# Patient Record
Sex: Male | Born: 1980 | Race: White | Hispanic: No | Marital: Single | Smoking: Current every day smoker
Health system: Southern US, Community
[De-identification: ages and names within clinical notes are randomized; demographics above are authoritative.]

## PROBLEM LIST (undated history)

## (undated) ENCOUNTER — Ambulatory Visit: Admission: EM | Payer: Commercial Managed Care - PPO | Source: Home / Self Care

## (undated) DIAGNOSIS — F909 Attention-deficit hyperactivity disorder, unspecified type: Secondary | ICD-10-CM

## (undated) HISTORY — PX: ELBOW SURGERY: SHX618

---

## 2010-08-03 ENCOUNTER — Emergency Department (HOSPITAL_COMMUNITY)
Admission: EM | Admit: 2010-08-03 | Discharge: 2010-08-03 | Disposition: A | Discharge status: Home / Self Care | Payer: Self-pay | Attending: Emergency Medicine | Admitting: Emergency Medicine

## 2010-08-03 ENCOUNTER — Emergency Department (HOSPITAL_COMMUNITY): Payer: Self-pay

## 2010-08-03 DIAGNOSIS — R221 Localized swelling, mass and lump, neck: Secondary | ICD-10-CM | POA: Insufficient documentation

## 2010-08-03 DIAGNOSIS — L0201 Cutaneous abscess of face: Secondary | ICD-10-CM | POA: Insufficient documentation

## 2010-08-03 DIAGNOSIS — R220 Localized swelling, mass and lump, head: Secondary | ICD-10-CM | POA: Insufficient documentation

## 2010-08-03 DIAGNOSIS — L03211 Cellulitis of face: Secondary | ICD-10-CM | POA: Insufficient documentation

## 2010-08-03 DIAGNOSIS — F988 Other specified behavioral and emotional disorders with onset usually occurring in childhood and adolescence: Secondary | ICD-10-CM | POA: Insufficient documentation

## 2010-08-03 DIAGNOSIS — B86 Scabies: Secondary | ICD-10-CM | POA: Insufficient documentation

## 2010-08-03 MED ORDER — IOHEXOL 300 MG/ML  SOLN
100.0000 mL | Freq: Once | INTRAMUSCULAR | Status: AC | PRN
  Administered 2010-08-03: 100 mL via INTRAVENOUS

## 2010-09-25 ENCOUNTER — Emergency Department (HOSPITAL_COMMUNITY)
Admission: EM | Admit: 2010-09-25 | Discharge: 2010-09-25 | Disposition: A | Discharge status: Home / Self Care | Payer: Self-pay | Attending: Emergency Medicine | Admitting: Emergency Medicine

## 2010-09-25 DIAGNOSIS — IMO0002 Reserved for concepts with insufficient information to code with codable children: Secondary | ICD-10-CM | POA: Insufficient documentation

## 2010-09-27 ENCOUNTER — Emergency Department (HOSPITAL_COMMUNITY)
Admission: EM | Admit: 2010-09-27 | Discharge: 2010-09-27 | Disposition: A | Discharge status: Home / Self Care | Payer: Self-pay | Attending: Emergency Medicine | Admitting: Emergency Medicine

## 2010-09-27 DIAGNOSIS — IMO0002 Reserved for concepts with insufficient information to code with codable children: Secondary | ICD-10-CM | POA: Insufficient documentation

## 2010-09-28 LAB — WOUND CULTURE

## 2010-12-06 ENCOUNTER — Emergency Department (HOSPITAL_COMMUNITY)
Admission: EM | Admit: 2010-12-06 | Discharge: 2010-12-06 | Disposition: A | Discharge status: Home / Self Care | Payer: Self-pay | Attending: Emergency Medicine | Admitting: Emergency Medicine

## 2010-12-06 DIAGNOSIS — IMO0002 Reserved for concepts with insufficient information to code with codable children: Secondary | ICD-10-CM | POA: Insufficient documentation

## 2012-04-05 ENCOUNTER — Encounter (HOSPITAL_COMMUNITY): Payer: Self-pay | Admitting: Emergency Medicine

## 2012-04-05 ENCOUNTER — Emergency Department (HOSPITAL_COMMUNITY)
Admission: EM | Admit: 2012-04-05 | Discharge: 2012-04-05 | Disposition: A | Discharge status: Home / Self Care | Payer: Self-pay | Attending: Emergency Medicine | Admitting: Emergency Medicine

## 2012-04-05 DIAGNOSIS — T542X1A Toxic effect of corrosive acids and acid-like substances, accidental (unintentional), initial encounter: Secondary | ICD-10-CM | POA: Insufficient documentation

## 2012-04-05 DIAGNOSIS — IMO0002 Reserved for concepts with insufficient information to code with codable children: Secondary | ICD-10-CM | POA: Insufficient documentation

## 2012-04-05 DIAGNOSIS — T5491XA Toxic effect of unspecified corrosive substance, accidental (unintentional), initial encounter: Secondary | ICD-10-CM | POA: Insufficient documentation

## 2012-04-05 DIAGNOSIS — X58XXXA Exposure to other specified factors, initial encounter: Secondary | ICD-10-CM | POA: Insufficient documentation

## 2012-04-05 DIAGNOSIS — Y9302 Activity, running: Secondary | ICD-10-CM | POA: Insufficient documentation

## 2012-04-05 DIAGNOSIS — T07XXXA Unspecified multiple injuries, initial encounter: Secondary | ICD-10-CM

## 2012-04-05 DIAGNOSIS — Y92009 Unspecified place in unspecified non-institutional (private) residence as the place of occurrence of the external cause: Secondary | ICD-10-CM | POA: Insufficient documentation

## 2012-04-05 DIAGNOSIS — R4182 Altered mental status, unspecified: Secondary | ICD-10-CM | POA: Insufficient documentation

## 2012-04-05 DIAGNOSIS — F19929 Other psychoactive substance use, unspecified with intoxication, unspecified: Secondary | ICD-10-CM

## 2012-04-05 DIAGNOSIS — F29 Unspecified psychosis not due to a substance or known physiological condition: Secondary | ICD-10-CM | POA: Insufficient documentation

## 2012-04-05 LAB — RAPID URINE DRUG SCREEN, HOSP PERFORMED
Amphetamines: POSITIVE — AB
Benzodiazepines: NOT DETECTED
Opiates: NOT DETECTED
Tetrahydrocannabinol: POSITIVE — AB

## 2012-04-05 LAB — URINALYSIS, ROUTINE W REFLEX MICROSCOPIC
Leukocytes, UA: NEGATIVE
Nitrite: NEGATIVE
Protein, ur: 100 mg/dL — AB
Specific Gravity, Urine: 1.026 (ref 1.005–1.030)
Urobilinogen, UA: 0.2 mg/dL (ref 0.0–1.0)

## 2012-04-05 LAB — CBC WITH DIFFERENTIAL/PLATELET
Basophils Absolute: 0 10*3/uL (ref 0.0–0.1)
Basophils Relative: 0 % (ref 0–1)
Eosinophils Absolute: 0 10*3/uL (ref 0.0–0.7)
Hemoglobin: 15.8 g/dL (ref 13.0–17.0)
MCH: 29.5 pg (ref 26.0–34.0)
MCHC: 34.1 g/dL (ref 30.0–36.0)
Neutro Abs: 9 10*3/uL — ABNORMAL HIGH (ref 1.7–7.7)
Neutrophils Relative %: 72 % (ref 43–77)
Platelets: 273 10*3/uL (ref 150–400)
RDW: 13 % (ref 11.5–15.5)

## 2012-04-05 LAB — BASIC METABOLIC PANEL
BUN: 16 mg/dL (ref 6–23)
Calcium: 10 mg/dL (ref 8.4–10.5)
GFR calc Af Amer: 64 mL/min — ABNORMAL LOW (ref >90)
GFR calc non Af Amer: 56 mL/min — ABNORMAL LOW (ref >90)
Glucose, Bld: 268 mg/dL — ABNORMAL HIGH (ref 70–99)
Potassium: 3.4 mEq/L — ABNORMAL LOW (ref 3.5–5.1)

## 2012-04-05 LAB — URINE MICROSCOPIC-ADD ON

## 2012-04-05 LAB — ETHANOL: Alcohol, Ethyl (B): <11 mg/dL (ref 0–11)

## 2012-04-05 MED ORDER — ZIPRASIDONE MESYLATE 20 MG IM SOLR
20.0000 mg | Freq: Once | INTRAMUSCULAR | Status: AC
  Administered 2012-04-05: 20 mg via INTRAMUSCULAR

## 2012-04-05 MED ORDER — ZIPRASIDONE MESYLATE 20 MG IM SOLR
INTRAMUSCULAR | Status: AC
  Administered 2012-04-05: 20 mg via INTRAMUSCULAR
  Filled 2012-04-05: qty 20

## 2012-04-05 MED ORDER — LACTATED RINGERS IV BOLUS (SEPSIS)
2000.0000 mL | Freq: Once | INTRAVENOUS | Status: AC
  Administered 2012-04-05: 2000 mL via INTRAVENOUS

## 2012-04-05 MED ORDER — SODIUM CHLORIDE 0.9 % IV SOLN: Freq: Once | INTRAVENOUS | Status: DC

## 2012-04-05 MED ORDER — SODIUM CHLORIDE 0.9 % IV SOLN
INTRAVENOUS | Status: DC
  Administered 2012-04-05 (×2): via INTRAVENOUS

## 2012-04-05 NOTE — ED Notes (Signed)
Pt has 1 cm open area to 4th finger right hand, area cleansed, DSD applied

## 2012-04-05 NOTE — ED Notes (Signed)
[employer] removed handcuff from pt after pt agreed to cooperate. Pt is cooperative and calm at this time. [employer] leaving a this time as there is no need for them to be at pt bedside. Sitter remains at pr bedside. NAD, VSS, bilateral, equal chest rise and fall. Will monitor

## 2012-04-05 NOTE — ED Notes (Signed)
Pt arrives via EMS, pt was found by [employer] running down the street w/o clothes, pt was restrained per [employer], EMS called, brought to [facility], resp even unlabored, skin pwd, pt has lacerations to hands, bleeding controlled

## 2012-04-05 NOTE — ED Provider Notes (Signed)
History     CSN: [id number]  Arrival date & time 04/05/12  0541   First MD Initiated Contact with Patient 04/05/12 0550      Chief Complaint  Patient presents with  . Hallucinations    (Consider location/radiation/quality/duration/timing/severity/associated sxs/prior treatment) HPI Level V caveat: Altered mental status, psychosis, uncooperative. This is a 31 year old white male who came home to his roommates this morning reporting that he had taken "acid". The patient subsequently had increasingly more bizarre behavior with agitation and apparent loosening of patient's. He began removing is close and ran outside. Police were called. He was uncooperative with police, rolling on the ground naked and resisting their authority. He was Tazered and brought to the ED in handcuffs. Police report he also states he may have taken some methamphetamine. He has multiple superficial abrasions and contusions over his body with some rash of the right hand it has been bleeding moderately.  No past medical history on file.  No past surgical history on file.  No family history on file.  History  Substance Use Topics  . Smoking status: Not on file  . Smokeless tobacco: Not on file  . Alcohol Use: Not on file      Review of Systems  Unable to perform ROS   Allergies  Review of patient's allergies indicates no known allergies.  Home Medications  No current outpatient prescriptions on file.  BP 130/77  Pulse 114  Temp 97.8 F (36.6 C)  Resp 13  SpO2 95%  Physical Exam General: Well-developed, well-nourished male; appearance consistent with age of record HENT: normocephalic, atraumatic; dry blood in mouth, no dental fractures seen once blood was removed Eyes: pupils equal round and reactive to light; extraocular muscles intact Neck: supple Heart: regular rate and rhythm Lungs: Normal respiratory effort and excursion Abdomen: soft; nondistended Extremities: No deformity; full range  of motion Neurologic: Awake, alert, agitated; motor function intact in all extremities and symmetric; no facial droop Skin: Multiple superficial abrasions and contusions; "road rash" of right hand Psychiatric: Confused, disoriented, alternately belligerent and cooperative, inappropriate speech and answers to questions    ED Course  Procedures (including critical care time)  CRITICAL CARE Performed by: Orissa Arreaga L   Total critical care time: 45 minutes  Critical care time was exclusive of separately billable procedures and treating other patients.  Critical care was necessary to treat or prevent imminent or life-threatening deterioration.  Critical care was time spent personally by me on the following activities: development of treatment plan with patient and/or surrogate as well as nursing, discussions with consultants, evaluation of patient's response to treatment, examination of patient, obtaining history from patient or surrogate, ordering and performing treatments and interventions, ordering and review of laboratory studies, ordering and review of radiographic studies, pulse oximetry and re-evaluation of patient's condition.    MDM   Nursing notes and vitals signs, including pulse oximetry, reviewed.  Summary of this visit's results, reviewed by myself:  Labs:  Results for orders placed during the hospital encounter of 04/05/12 (from the past 24 hour(s))  ETHANOL     Status: Normal   Collection Time   04/05/12  6:10 AM      Component Value Range   Alcohol, Ethyl (B) <11  0 - 11 mg/dL  BASIC METABOLIC PANEL     Status: Abnormal   Collection Time   04/05/12  6:10 AM      Component Value Range   Sodium 136  135 - 145 mEq/L   Potassium  3.4 (*) 3.5 - 5.1 mEq/L   Chloride 93 (*) 96 - 112 mEq/L   CO2 17 (*) 19 - 32 mEq/L   Glucose, Bld 268 (*) 70 - 99 mg/dL   BUN 16  6 - 23 mg/dL   Creatinine, Ser 1.61 (*) 0.50 - 1.35 mg/dL   Calcium 09.6  8.4 - 04.5 mg/dL   GFR calc  non Af Amer 56 (*) >90 mL/min   GFR calc Af Amer 64 (*) >90 mL/min  CBC WITH DIFFERENTIAL     Status: Abnormal   Collection Time   04/05/12  6:10 AM      Component Value Range   WBC 12.5 (*) 4.0 - 10.5 K/uL   RBC 5.35  4.22 - 5.81 MIL/uL   Hemoglobin 15.8  13.0 - 17.0 g/dL   HCT 40.9  81.1 - 91.4 %   MCV 86.5  78.0 - 100.0 fL   MCH 29.5  26.0 - 34.0 pg   MCHC 34.1  30.0 - 36.0 g/dL   RDW 78.2  95.6 - 21.3 %   Platelets 273  150 - 400 K/uL   Neutrophils Relative 72  43 - 77 %   Neutro Abs 9.0 (*) 1.7 - 7.7 K/uL   Lymphocytes Relative 22  12 - 46 %   Lymphs Abs 2.7  0.7 - 4.0 K/uL   Monocytes Relative 5  3 - 12 %   Monocytes Absolute 0.7  0.1 - 1.0 K/uL   Eosinophils Relative 0  0 - 5 %   Eosinophils Absolute 0.0  0.0 - 0.7 K/uL   Basophils Relative 0  0 - 1 %   Basophils Absolute 0.0  0.0 - 0.1 K/uL  URINE RAPID DRUG SCREEN (HOSP PERFORMED)     Status: Abnormal   Collection Time   04/05/12  6:21 AM      Component Value Range   Opiates NONE DETECTED  NONE DETECTED   Cocaine NONE DETECTED  NONE DETECTED   Benzodiazepines NONE DETECTED  NONE DETECTED   Amphetamines POSITIVE (*) NONE DETECTED   Tetrahydrocannabinol POSITIVE (*) NONE DETECTED   Barbiturates NONE DETECTED  NONE DETECTED  URINALYSIS, ROUTINE W REFLEX MICROSCOPIC     Status: Abnormal   Collection Time   04/05/12  6:21 AM      Component Value Range   Color, Urine YELLOW  YELLOW   APPearance CLOUDY (*) CLEAR   Specific Gravity, Urine 1.026  1.005 - 1.030   pH 5.5  5.0 - 8.0   Glucose, UA NEGATIVE  NEGATIVE mg/dL   Hgb urine dipstick SMALL (*) NEGATIVE   Bilirubin Urine NEGATIVE  NEGATIVE   Ketones, ur NEGATIVE  NEGATIVE mg/dL   Protein, ur 086 (*) NEGATIVE mg/dL   Urobilinogen, UA 0.2  0.0 - 1.0 mg/dL   Nitrite NEGATIVE  NEGATIVE   Leukocytes, UA NEGATIVE  NEGATIVE  URINE MICROSCOPIC-ADD ON     Status: Abnormal   Collection Time   04/05/12  6:21 AM      Component Value Range   Squamous Epithelial / LPF  RARE  RARE   WBC, UA 0-2  <3 WBC/hpf   RBC / HPF 0-2  <3 RBC/hpf   Bacteria, UA MANY (*) RARE   Casts HYALINE CASTS (*) NEGATIVE   Sperm, UA PRESENT       EKG Interpretation:  Date & Time: 04/05/2012 6:08 AM  Rate: 132  Rhythm: sinus tachycardia  QRS Axis: normal  Intervals: QT prolonged  ST/T Wave  abnormalities: normal  Conduction Disutrbances:none  Narrative Interpretation:   Old EKG Reviewed: none available  6:56 AM Patient sleeping comfortably now after Geodon 20 mg IM. Patient placed on warming blanket and core temperature is now 69 F. Patient noted to be positive for amphetamines and THC.         Carlisle Beers Julieta Rogalski, MD 04/05/12 0700

## 2012-04-05 NOTE — ED Notes (Signed)
Sitter at bedside. Pt resting quietly. VSS, NAD. Will monitor

## 2012-04-05 NOTE — ED Provider Notes (Signed)
Assume care from Dr. Read Drivers, 0730- patient remains sedated. Heart rate 105. Blood sugar 96 systolic. IV fluid boluses, 2 L have been ordered. Temperature 98.6 by Foley catheter sensor.  Reevaluation: 08:52- blood pressure 116/63. He remains sedated. He continues to have very poor urinary output. Will give third liter of IV fluids.  Reevaluation: 09:23- he is more alert now, awakes, and talks appropriately; then, dozes off. Blood pressure, normal, stable.  Reevaluation: 13:00- he is alert, calm, lucid. He remembers many of the events from last night. He, states that his only ingestion was "acid". He complains of minor abrasions and contusions, but otherwise denies pain or limitations. He has tolerated food and fluid here in the ER.  Medical decision making: Acute and transient altered mental status, secondary to ingestion of unknown medication. Patient has recovered his baseline, stable for discharge.  Diagnosis #1 altered mental status . #2 substance abuse  Plan: Return if needed. His IVC was rescinded.  Results for orders placed during the hospital encounter of 04/05/12  ETHANOL      Component Value Range   Alcohol, Ethyl (B) <11  0 - 11 mg/dL  URINE RAPID DRUG SCREEN (HOSP PERFORMED)      Component Value Range   Opiates NONE DETECTED  NONE DETECTED   Cocaine NONE DETECTED  NONE DETECTED   Benzodiazepines NONE DETECTED  NONE DETECTED   Amphetamines POSITIVE (*) NONE DETECTED   Tetrahydrocannabinol POSITIVE (*) NONE DETECTED   Barbiturates NONE DETECTED  NONE DETECTED  BASIC METABOLIC PANEL      Component Value Range   Sodium 136  135 - 145 mEq/L   Potassium 3.4 (*) 3.5 - 5.1 mEq/L   Chloride 93 (*) 96 - 112 mEq/L   CO2 17 (*) 19 - 32 mEq/L   Glucose, Bld 268 (*) 70 - 99 mg/dL   BUN 16  6 - 23 mg/dL   Creatinine, Ser 1.47 (*) 0.50 - 1.35 mg/dL   Calcium 82.9  8.4 - 56.2 mg/dL   GFR calc non Af Amer 56 (*) >90 mL/min   GFR calc Af Amer 64 (*) >90 mL/min  CBC WITH DIFFERENTIAL     Component Value Range   WBC 12.5 (*) 4.0 - 10.5 K/uL   RBC 5.35  4.22 - 5.81 MIL/uL   Hemoglobin 15.8  13.0 - 17.0 g/dL   HCT 13.0  86.5 - 78.4 %   MCV 86.5  78.0 - 100.0 fL   MCH 29.5  26.0 - 34.0 pg   MCHC 34.1  30.0 - 36.0 g/dL   RDW 69.6  29.5 - 28.4 %   Platelets 273  150 - 400 K/uL   Neutrophils Relative 72  43 - 77 %   Neutro Abs 9.0 (*) 1.7 - 7.7 K/uL   Lymphocytes Relative 22  12 - 46 %   Lymphs Abs 2.7  0.7 - 4.0 K/uL   Monocytes Relative 5  3 - 12 %   Monocytes Absolute 0.7  0.1 - 1.0 K/uL   Eosinophils Relative 0  0 - 5 %   Eosinophils Absolute 0.0  0.0 - 0.7 K/uL   Basophils Relative 0  0 - 1 %   Basophils Absolute 0.0  0.0 - 0.1 K/uL  URINALYSIS, ROUTINE W REFLEX MICROSCOPIC      Component Value Range   Color, Urine YELLOW  YELLOW   APPearance CLOUDY (*) CLEAR   Specific Gravity, Urine 1.026  1.005 - 1.030   pH 5.5  5.0 -  8.0   Glucose, UA NEGATIVE  NEGATIVE mg/dL   Hgb urine dipstick SMALL (*) NEGATIVE   Bilirubin Urine NEGATIVE  NEGATIVE   Ketones, ur NEGATIVE  NEGATIVE mg/dL   Protein, ur 409 (*) NEGATIVE mg/dL   Urobilinogen, UA 0.2  0.0 - 1.0 mg/dL   Nitrite NEGATIVE  NEGATIVE   Leukocytes, UA NEGATIVE  NEGATIVE  URINE MICROSCOPIC-ADD ON      Component Value Range   Squamous Epithelial / LPF RARE  RARE   WBC, UA 0-2  <3 WBC/hpf   RBC / HPF 0-2  <3 RBC/hpf   Bacteria, UA MANY (*) RARE   Casts HYALINE CASTS (*) NEGATIVE   Sperm, UA PRESENT      Flint Melter, MD 04/06/12 1240

## 2012-04-05 NOTE — ED Notes (Signed)
Pt brought to ED after roommate called PD re pt acting erratic, breaking windows, yelling. Pt found by [employer] in street naked, aggressive. Pt tazed and handcuffed by [employer]. Pt intermittently yelling, repeating questions. Inappropriate answered to questions. Pt alert with abrasions to face,arms,hands,legs,feet. Pt cleaned by tech, including mouth, teeth which were coated in dried blood. Pt very cold to touch, bair hugger applied. Pt given Geodon IM. Pt placed on CCM, temp probe foley placed. IV est, labs drawn, IVF started. Forensic restraints remain in place. [employer] at bedside.

## 2012-04-06 LAB — URINE CULTURE: Colony Count: NO GROWTH

## 2012-04-08 ENCOUNTER — Encounter (HOSPITAL_COMMUNITY): Payer: Self-pay | Admitting: Emergency Medicine

## 2012-04-08 ENCOUNTER — Emergency Department (HOSPITAL_COMMUNITY)
Admission: EM | Admit: 2012-04-08 | Discharge: 2012-04-08 | Disposition: A | Discharge status: Home / Self Care | Payer: Self-pay | Attending: Emergency Medicine | Admitting: Emergency Medicine

## 2012-04-08 ENCOUNTER — Emergency Department (HOSPITAL_COMMUNITY): Payer: Self-pay

## 2012-04-08 DIAGNOSIS — M25572 Pain in left ankle and joints of left foot: Secondary | ICD-10-CM

## 2012-04-08 DIAGNOSIS — Z79899 Other long term (current) drug therapy: Secondary | ICD-10-CM | POA: Insufficient documentation

## 2012-04-08 DIAGNOSIS — F17200 Nicotine dependence, unspecified, uncomplicated: Secondary | ICD-10-CM | POA: Insufficient documentation

## 2012-04-08 DIAGNOSIS — M25579 Pain in unspecified ankle and joints of unspecified foot: Secondary | ICD-10-CM | POA: Insufficient documentation

## 2012-04-08 MED ORDER — MELOXICAM 15 MG PO TABS: 15.0000 mg | ORAL_TABLET | Freq: Every day | ORAL | Status: DC

## 2012-04-08 MED ORDER — IBUPROFEN 800 MG PO TABS
800.0000 mg | ORAL_TABLET | Freq: Once | ORAL | Status: DC
  Filled 2012-04-08: qty 1

## 2012-04-08 NOTE — ED Provider Notes (Signed)
History     CSN: [id number]  Arrival date & time 04/08/12  0607   First MD Initiated Contact with Patient 04/08/12 [phone]      Chief Complaint  Patient presents with  . Ankle Pain    (Consider location/radiation/quality/duration/timing/severity/associated sxs/prior treatment) HPI Comments: Jeffery Aguilar 31 y.o. male   The chief complaint is: Patient presents with:   Ankle Pain     31 year old male presents to emergency department with chief complaint of left foot and ankle pain.  He states he has had intermittent foot pain bilaterally over the past few years.  He has had a 40 pound weight gain over the past year which he feels contributes to his foot pain.  He states that last night he was D. gating and with stopping his left foot music.  He experienced sharp pain in his foot and ankle and was unable to bear weight.  He has noticed swelling over the dorsum of the foot and bilateral in the malleoli.   He denies any recent injury to the foot, although patient was seen here in the emergency department per min recently in drug-induced psychosis has multiple abrasions over the body and may have injured his ankle unknowingly at that time. Denies fevers, chills, myalgias, arthralgias. Denies DOE, SOB, chest tightness or pressure, radiation to left arm, jaw or back, or diaphoresis. Denies dysuria, flank pain, suprapubic pain, frequency, urgency, or hematuria. Denies headaches, light headedness, weakness, visual disturbances. Denies abdominal pain, nausea, vomiting, diarrhea or constipation.    Patient is a 31 y.o. male presenting with ankle pain. The history is provided by the patient. No language interpreter was used.  Ankle Pain     History reviewed. No pertinent past medical history.  Past Surgical History  Procedure Date  . Elbow surgery     History reviewed. No pertinent family history.  History  Substance Use Topics  . Smoking status: Current Every Day Smoker    Types:  Cigarettes  . Smokeless tobacco: Not on file  . Alcohol Use: Yes     Comment: rare      Review of Systems Ten systems reviewed and are negative for acute change, except as noted in the HPI.   Allergies  Review of patient's allergies indicates no known allergies.  Home Medications   Current Outpatient Rx  Name  Route  Sig  Dispense  Refill  . ALPRAZOLAM 1 MG PO TABS   Oral   Take 1 mg by mouth 2 (two) times daily as needed. For anxiety         . AMPHETAMINE-DEXTROAMPHETAMINE 20 MG PO TABS   Oral   Take 20 mg by mouth 3 (three) times daily.          . IBUPROFEN 200 MG PO TABS   Oral   Take 800 mg by mouth every 6 (six) hours as needed. For pain           BP 126/92  Pulse 108  Temp 98.9 F (37.2 C) (Oral)  Resp 20  SpO2 100%  Physical Exam Physical Exam  Nursing note and vitals reviewed. Constitutional: He appears well-developed and well-nourished. No distress.  HENT:  Head: Normocephalic and atraumatic.  Eyes: Conjunctivae normal are normal. No scleral icterus.  Neck: Normal range of motion. Neck supple.  Cardiovascular: Normal rate, regular rhythm and normal heart sounds.   Pulmonary/Chest: Effort normal and breath sounds normal. No respiratory distress.  Abdominal: Soft. There is no tenderness.  Musculoskeletal:  A bilateral  ankle exam was performed. Left ankle exhibits Skin: normal Swelling:  mild Warmth: no warmth Tenderness: moderate and dorsal taso-metatarsal junction and  BL malloeoli  ROM: limited by pain Strength:  limited by pain Neurological Exam: normal Vascular Exam: normal Neurological: A&O  x4 Skin: Skin is warm and dry. He is not diaphoretic.  Psychiatric: His behavior is normal.    ED Course  Procedures (including critical care time)  Labs Reviewed - No data to display Dg Ankle Complete Left  04/08/2012  *RADIOLOGY REPORT*  Clinical Data: Pain.  LEFT ANKLE COMPLETE - 3+ VIEW  Comparison: None.  Findings: There is no evidence  for fracture, subluxation or dislocation.  No worrisome lytic or sclerotic osseous lesion. There is some trace spurring in the anterior tibiotalar joint.  IMPRESSION: No acute bony findings.  Mild degenerative change.   Original Report Authenticated By: Kennith Center, M.D.      No diagnosis found.    MDM  7:17 AM BP 126/92  Pulse 108  Temp 98.9 F (37.2 C) (Oral)  Resp 20  SpO2 100% Patient with ankle pain and swelling. No specific MOI. Obtaining xray.   Negative xray. Will d/c ASO ankle splint, crutches and podiatric f/u.  Meloxicam for inflammation, Discussed reasons to seek immediate care. Patient expresses understanding and agrees with plan.    Arthor Captain, PA-C 04/08/12 1547

## 2012-04-08 NOTE — ED Notes (Signed)
Pt states he is having pain in his left ankle  Pt has swelling noted  Pt states he has been having problems with it for some time but it is hurting worse

## 2012-04-09 NOTE — ED Provider Notes (Signed)
Medical screening examination/treatment/procedure(s) were performed by non-physician practitioner and as supervising physician I was immediately available for consultation/collaboration.    Vida Roller, MD 04/09/12 [phone]

## 2012-06-01 ENCOUNTER — Encounter (HOSPITAL_COMMUNITY): Payer: Self-pay | Admitting: Emergency Medicine

## 2012-06-01 ENCOUNTER — Emergency Department (HOSPITAL_COMMUNITY): Payer: Self-pay

## 2012-06-01 ENCOUNTER — Emergency Department (HOSPITAL_COMMUNITY)
Admission: EM | Admit: 2012-06-01 | Discharge: 2012-06-01 | Disposition: A | Discharge status: Home / Self Care | Payer: Self-pay | Attending: Emergency Medicine | Admitting: Emergency Medicine

## 2012-06-01 DIAGNOSIS — F17200 Nicotine dependence, unspecified, uncomplicated: Secondary | ICD-10-CM | POA: Insufficient documentation

## 2012-06-01 DIAGNOSIS — Y9389 Activity, other specified: Secondary | ICD-10-CM | POA: Insufficient documentation

## 2012-06-01 DIAGNOSIS — S93402A Sprain of unspecified ligament of left ankle, initial encounter: Secondary | ICD-10-CM

## 2012-06-01 DIAGNOSIS — Z79899 Other long term (current) drug therapy: Secondary | ICD-10-CM | POA: Insufficient documentation

## 2012-06-01 DIAGNOSIS — M779 Enthesopathy, unspecified: Secondary | ICD-10-CM

## 2012-06-01 DIAGNOSIS — IMO0002 Reserved for concepts with insufficient information to code with codable children: Secondary | ICD-10-CM | POA: Insufficient documentation

## 2012-06-01 DIAGNOSIS — S93409A Sprain of unspecified ligament of unspecified ankle, initial encounter: Secondary | ICD-10-CM | POA: Insufficient documentation

## 2012-06-01 DIAGNOSIS — Y929 Unspecified place or not applicable: Secondary | ICD-10-CM | POA: Insufficient documentation

## 2012-06-01 MED ORDER — IBUPROFEN 600 MG PO TABS: 600.0000 mg | ORAL_TABLET | Freq: Four times a day (QID) | ORAL | Status: DC | PRN

## 2012-06-01 MED ORDER — TRAMADOL HCL 50 MG PO TABS: 50.0000 mg | ORAL_TABLET | Freq: Four times a day (QID) | ORAL | Status: DC | PRN

## 2012-06-01 NOTE — ED Notes (Signed)
Pt reports being seen here for same last week.  Pt reports his L foot started to swell x 2 days.

## 2012-06-01 NOTE — ED Provider Notes (Signed)
History     CSN: [id number]  Arrival date & time 06/01/12  1201   First MD Initiated Contact with Patient 06/01/12 1231      Chief Complaint  Patient presents with  . Foot Pain    (Consider location/radiation/quality/duration/timing/severity/associated sxs/prior treatment) HPI Jeffery Aguilar is a 32 y.o. male who presents to ED with complaint of left foot and ankle swelling and pain. States initially injured it about 3 wks ago, after he was tased by police and he fell down twisting his ankle. He was seen in ER, had negative x-rays. States since then he has not been resting it. States has been working on it "a lot" and not sure if he twisted it again. States now ankle and foot is even more swollen. Denies calf pain. No hx of DVTs. No numbness or weakness in foot.   History reviewed. No pertinent past medical history.  Past Surgical History  Procedure Laterality Date  . Elbow surgery      History reviewed. No pertinent family history.  History  Substance Use Topics  . Smoking status: Current Every Day Smoker    Types: Cigarettes  . Smokeless tobacco: Not on file  . Alcohol Use: Yes     Comment: rare      Review of Systems  Musculoskeletal: Positive for joint swelling and arthralgias.  Skin: Negative for wound.  Neurological: Negative for weakness and numbness.    Allergies  Review of patient's allergies indicates no known allergies.  Home Medications   Current Outpatient Rx  Name  Route  Sig  Dispense  Refill  . ALPRAZolam (XANAX) 1 MG tablet   Oral   Take 1 mg by mouth 2 (two) times daily as needed. For anxiety         . amphetamine-dextroamphetamine (ADDERALL) 20 MG tablet   Oral   Take 20 mg by mouth 3 (three) times daily.          Marland Kitchen ibuprofen (ADVIL,MOTRIN) 200 MG tablet   Oral   Take 800 mg by mouth every 6 (six) hours as needed. For pain           BP 132/85  Pulse 110  Temp(Src) 98 F (36.7 C) (Oral)  SpO2 98%  Physical Exam  Nursing note  and vitals reviewed. Constitutional: He appears well-developed and well-nourished. No distress.  Musculoskeletal:  Swelling noted to the left ankle and foot. Tender to palpation over lateral and medial melleoli. Pain with ROM of the ankle. Normal dorsal pedal pulses. No calf swelling or tenderness.   Neurological: He is alert.  Skin: Skin is warm and dry.    ED Course  Procedures (including critical care time)  Labs Reviewed - No data to display Dg Ankle Complete Left  06/01/2012  *RADIOLOGY REPORT*  Clinical Data: Lateral pain and swelling  LEFT ANKLE COMPLETE - 3+ VIEW  Comparison: 04/08/2012  Findings: There is lateral soft tissue swelling, more pronounced than was seen previously.  No evidence of fracture.  There is mild anterior spurring at the tibiotalar articulation.  IMPRESSION: More prominent lateral soft tissue swelling.  Anterior degenerative spurring at the tibiotalar articulation appears the same.   Original Report Authenticated By: Paulina Fusi, M.D.    Dg Foot Complete Left  06/01/2012  *RADIOLOGY REPORT*  Clinical Data: Lateral pain and swelling  LEFT FOOT - COMPLETE 3+ VIEW  Comparison: None.  Findings: There is some spurring at the calcaneocuboid articulation.  I question if there could be coalition at the  calcaneonavicular level.  IMPRESSION: Mild spurring at the calcaneocuboid articulation.  Question the possibility of calcaneonavicular coalition.   Original Report Authenticated By: Paulina Fusi, M.D.      1. Left ankle sprain   2. Tendonitis       MDM  Pt with left foot swelling, injury 3 wks ago. Unsure of any new injuries. No signs of joint infection. Joints stable. 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 Will treat with NSAIDs, ultram. Crutches. Elevation of the foot. ACE wrap. Follow up with orthopedics. Referral given.        Lottie Mussel, PA 06/01/12 1413  Lottie Mussel, [place] 06/01/12 1429

## 2012-06-01 NOTE — ED Notes (Addendum)
Pt seen in December for left foot pain, referred to orthopedist but states he hasn't been since he doesn't have insurance.  Pt states 2 days ago, foot began to hurt again and became swollen.  Pt denies pain at this time but states swelling has not improved.  Minimal swelling noted to lateral aspect of left foot.

## 2012-06-04 NOTE — ED Provider Notes (Signed)
Medical screening examination/treatment/procedure(s) were performed by non-physician practitioner and as supervising physician I was immediately available for consultation/collaboration.  Tae Robak, MD 06/04/12 0746 

## 2012-11-18 ENCOUNTER — Emergency Department (HOSPITAL_COMMUNITY)
Admission: EM | Admit: 2012-11-18 | Discharge: 2012-11-19 | Disposition: A | Discharge status: Home / Self Care | Payer: Self-pay | Attending: Emergency Medicine | Admitting: Emergency Medicine

## 2012-11-18 ENCOUNTER — Encounter (HOSPITAL_COMMUNITY): Payer: Self-pay

## 2012-11-18 DIAGNOSIS — Z79899 Other long term (current) drug therapy: Secondary | ICD-10-CM | POA: Insufficient documentation

## 2012-11-18 DIAGNOSIS — Z9889 Other specified postprocedural states: Secondary | ICD-10-CM | POA: Insufficient documentation

## 2012-11-18 DIAGNOSIS — F17200 Nicotine dependence, unspecified, uncomplicated: Secondary | ICD-10-CM | POA: Insufficient documentation

## 2012-11-18 DIAGNOSIS — M7989 Other specified soft tissue disorders: Secondary | ICD-10-CM | POA: Insufficient documentation

## 2012-11-18 DIAGNOSIS — M25529 Pain in unspecified elbow: Secondary | ICD-10-CM | POA: Insufficient documentation

## 2012-11-18 DIAGNOSIS — M25521 Pain in right elbow: Secondary | ICD-10-CM

## 2012-11-18 DIAGNOSIS — R209 Unspecified disturbances of skin sensation: Secondary | ICD-10-CM | POA: Insufficient documentation

## 2012-11-18 NOTE — ED Notes (Signed)
Pts right elbow is swollen and sore, he had surgery on it when he was younger, about one month ago he hit it and it went numb for a brief time, yesterday it started to swell in that area

## 2012-11-19 ENCOUNTER — Emergency Department (HOSPITAL_COMMUNITY): Payer: Self-pay

## 2012-11-19 MED ORDER — IBUPROFEN 800 MG PO TABS
800.0000 mg | ORAL_TABLET | Freq: Once | ORAL | Status: AC
  Administered 2012-11-19: 800 mg via ORAL
  Filled 2012-11-19: qty 1

## 2012-11-19 MED ORDER — MELOXICAM 15 MG PO TABS: 15.0000 mg | ORAL_TABLET | Freq: Every day | ORAL | Status: DC

## 2012-11-19 NOTE — ED Provider Notes (Signed)
CSN: [id number]     Arrival date & time 11/18/12  2338 History     First MD Initiated Contact with Patient 11/19/12 0000     Chief Complaint  Patient presents with  . Elbow Pain   HPI  History provided by the patient. Patient is a 32 year old male with past history of right elbow surgery orthopedic pinning who presents with complaints of right elbow pain and swelling. Pain and swelling began yesterday and are worse with any movements or palpation. Patient denies any recent injury or trauma but does report hitting his elbow one month ago while at work against his truck. He does report having immediate numbness shooting down his entire arm with pain but his symptoms seem to have improved. He did have some mild discomforts to the elbow since that time. Patient has done some increased activity and work with his right hand. 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 He denies any redness or skin change. Denies any associated weakness to the hand or fingers. No fever, chills or sweats. He has not used any treatments for symptoms. No other aggravating or alleviating factors. No other associated symptoms.     History reviewed. No pertinent past medical history. Past Surgical History  Procedure Laterality Date  . Elbow surgery     History reviewed. No pertinent family history. History  Substance Use Topics  . Smoking status: Current Every Day Smoker    Types: Cigarettes  . Smokeless tobacco: Not on file  . Alcohol Use: Yes     Comment: rare    Review of Systems  Constitutional: Negative for fever and chills.  Neurological: Positive for numbness. Negative for weakness.  All other systems reviewed and are negative.    Allergies  Review of patient's allergies indicates no known allergies.  Home Medications   Current Outpatient Rx  Name  Route  Sig  Dispense  Refill  . ALPRAZolam (XANAX) 1 MG tablet   Oral   Take 1 mg by mouth 2 (two) times daily as needed.  For anxiety         . amphetamine-dextroamphetamine (ADDERALL) 20 MG tablet   Oral   Take 20 mg by mouth 3 (three) times daily.           BP 141/97  Pulse 115  Temp(Src) 99 F (37.2 C) (Oral)  Resp 18  SpO2 96% Physical Exam  Nursing note and vitals reviewed. Constitutional: He is oriented to person, place, and time. He appears well-developed and well-nourished. No distress.  HENT:  Head: Normocephalic.  Cardiovascular: Normal rate and regular rhythm.   No murmur heard. Pulmonary/Chest: Effort normal and breath sounds normal. No respiratory distress. He has no wheezes. He has no rales.  Abdominal: Soft.  Musculoskeletal: He exhibits tenderness.  Old surgical scar over the medial aspect of the right elbow consistent with history of surgery. There is mild swelling to this area with tenderness to palpation. Mild tenderness extending into the medial forearm. Mildly reduced range of motion at the elbow with pain. Normal grip strength the right hand. Normal distal pulses and cap refill. Normal sensation to the fingertips to light touch.  Neurological: He is alert and oriented to person, place, and time.  Skin: Skin is warm. No rash noted. No erythema.  Psychiatric: He has a normal mood and affect. His behavior is normal.    ED Course   Procedures   Dg Elbow Complete Right  11/19/2012   *RADIOLOGY REPORT*  Clinical Data: Right elbow pain with medial soft tissue swelling.  RIGHT ELBOW - COMPLETE 3+ VIEW  Comparison: None.  Findings: There is soft tissue swelling overlying the medial epicondyle.  No underlying foreign body or soft tissue gas is identified.  Bony structures are intact without evidence of fracture or dislocation.  No joint effusion is appreciated.  IMPRESSION: Medial soft tissue swelling.  No evidence of underlying bony abnormality or joint effusion.   Original Report Authenticated By: Irish Lack, M.D.     1. Elbow joint pain, right     MDM  12:30AM patient  seen and evaluated. Patient well-appearing does not appear in any acute distress.  X-rays reviewed and unremarkable. There is slight swelling to the medial aspect consistent with exam. This may be bursitis or local inflammatory reaction from use. Will recommend RICE treatment.   Angus Seller, PA-C 11/19/12 [phone]

## 2012-11-19 NOTE — ED Provider Notes (Signed)
Medical screening examination/treatment/procedure(s) were performed by non-physician practitioner and as supervising physician I was immediately available for consultation/collaboration.  Marshel Golubski, MD 11/19/12 0402 

## 2013-03-15 DIAGNOSIS — F909 Attention-deficit hyperactivity disorder, unspecified type: Secondary | ICD-10-CM | POA: Insufficient documentation

## 2013-05-16 IMAGING — CR DG FOOT COMPLETE 3+V*L*
3 series · 3 of 3 positions shown · non-contrast
Comparison: None.

CLINICAL DATA: Lateral pain and swelling

LEFT FOOT - COMPLETE 3+ VIEW

[x foot lat left]
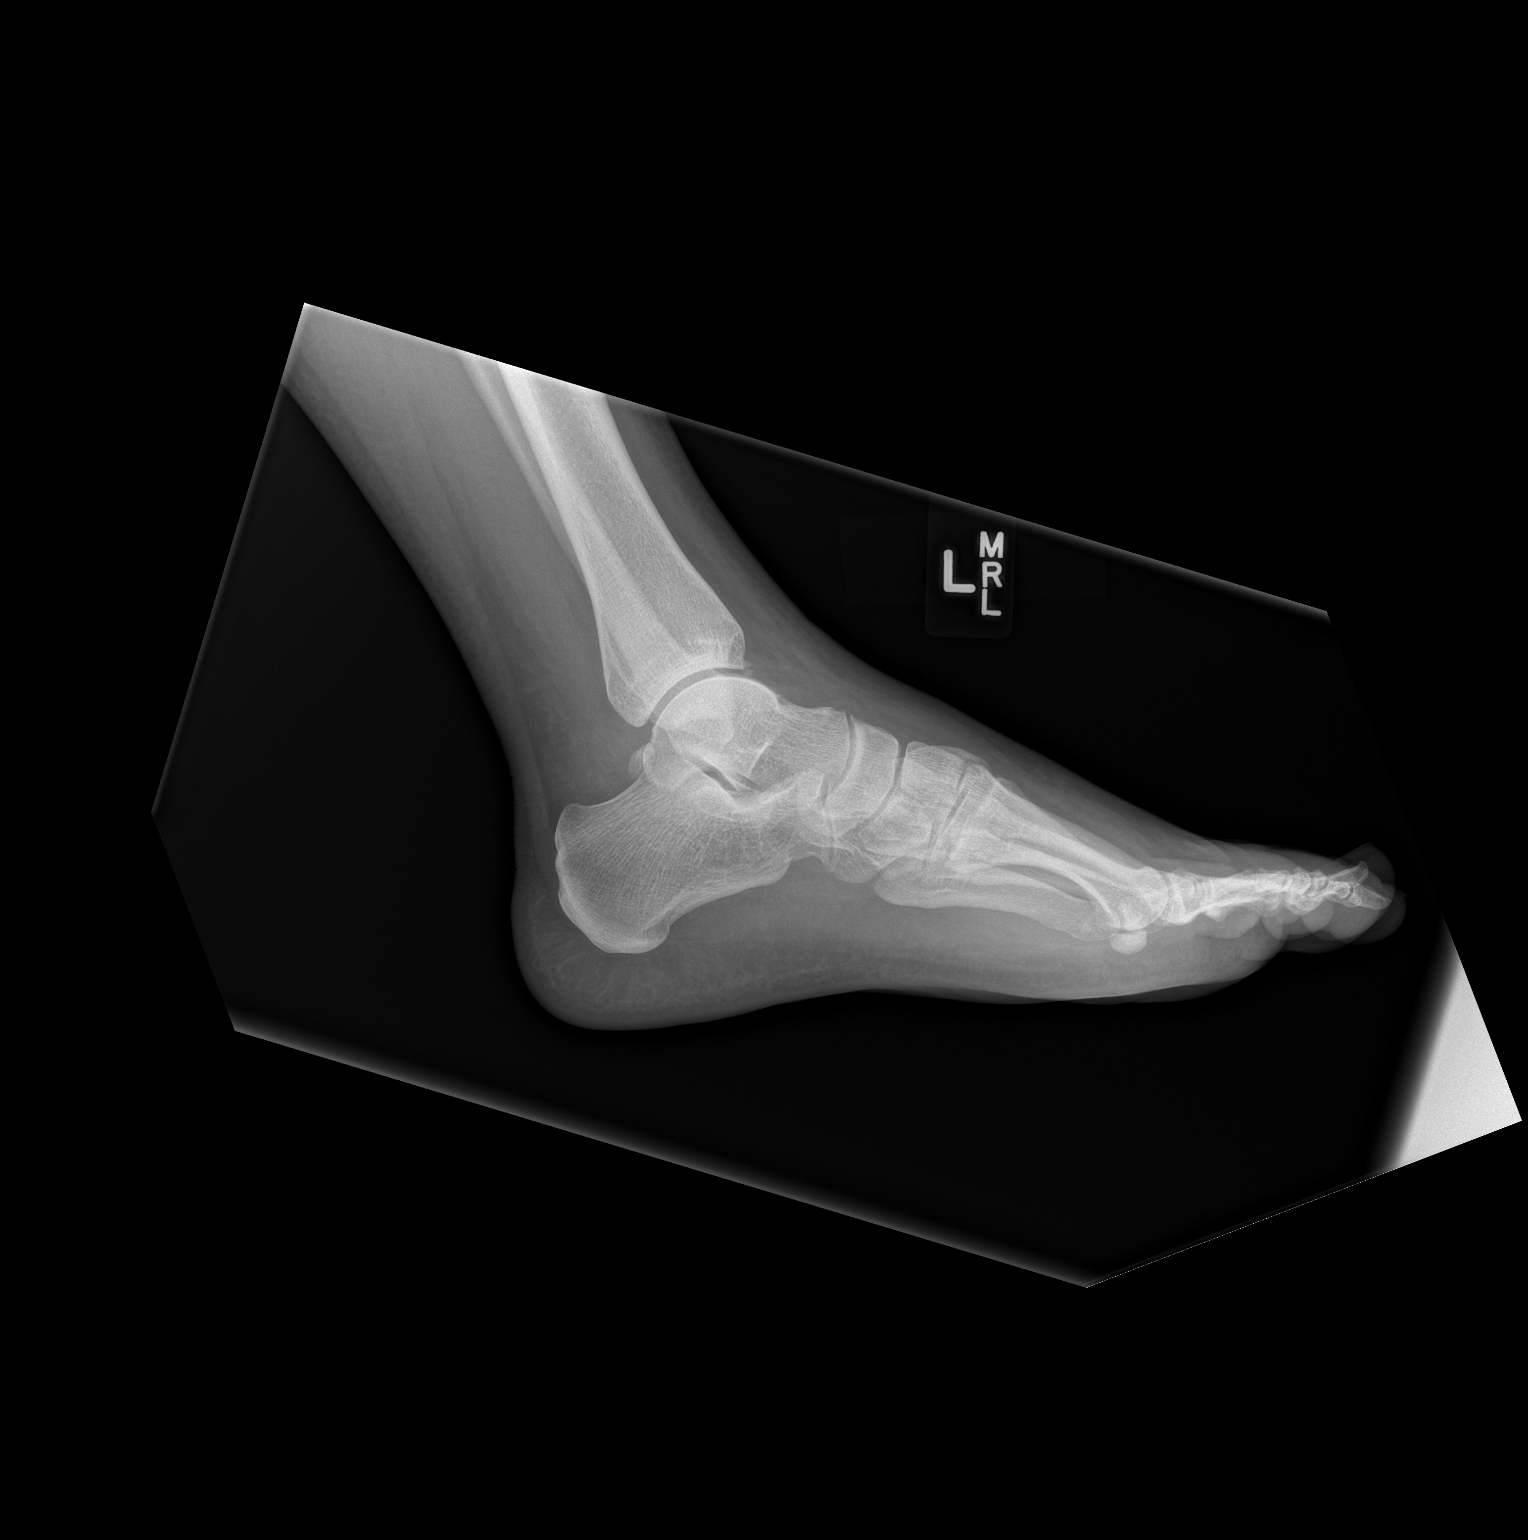

[x foot ap left]
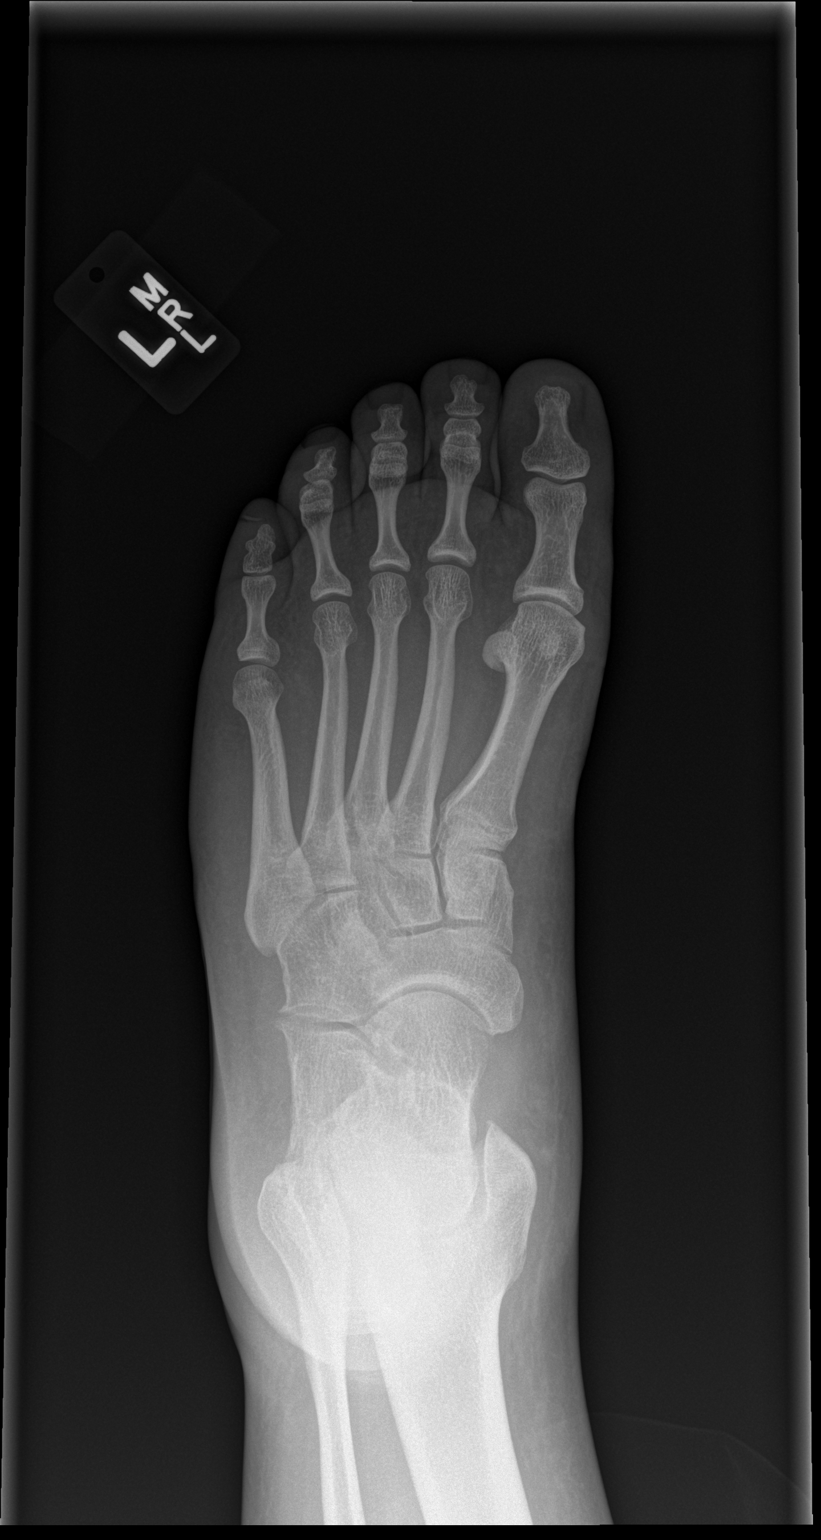

[x foot obl left]
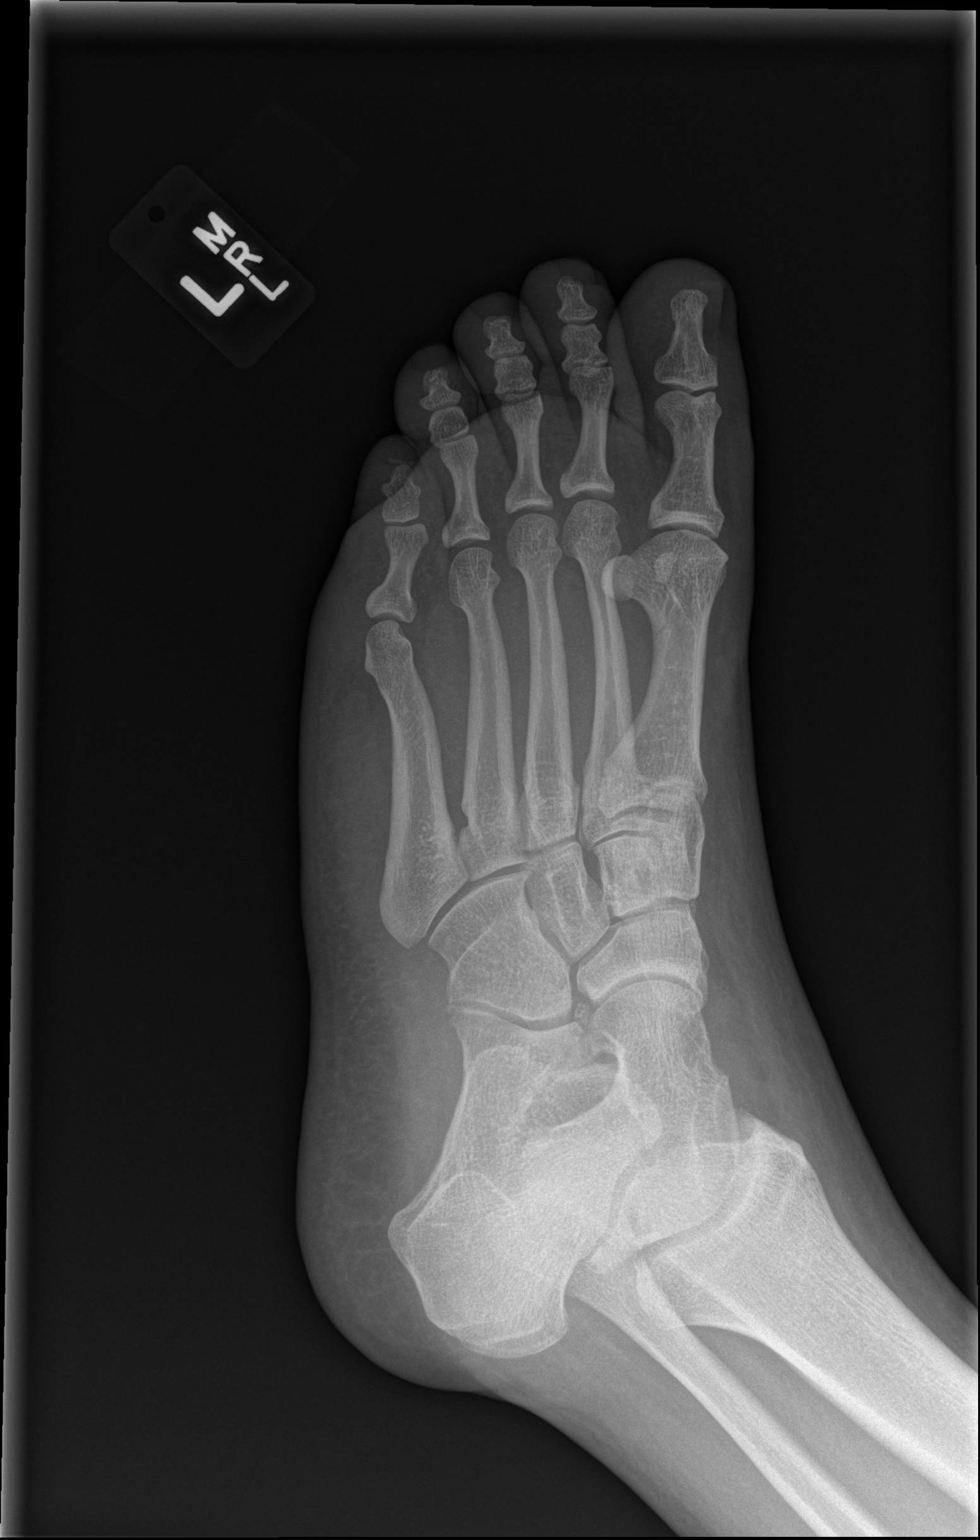

[3 of 3 positions shown; findings below may reference images not displayed]

FINDINGS: There is some spurring at the calcaneocuboid
articulation.  I question if there could be coalition at the
calcaneonavicular level.
IMPRESSION: Mild spurring at the calcaneocuboid articulation.  Question the
possibility of calcaneonavicular coalition.

## 2019-10-02 ENCOUNTER — Ambulatory Visit
Admission: EM | Admit: 2019-10-02 | Discharge: 2019-10-02 | Disposition: A | Discharge status: Home / Self Care | Payer: Self-pay

## 2019-10-02 ENCOUNTER — Encounter: Payer: Self-pay | Admitting: Emergency Medicine

## 2019-10-02 ENCOUNTER — Other Ambulatory Visit: Payer: Self-pay

## 2019-10-02 DIAGNOSIS — L03022 Acute lymphangitis of left finger: Secondary | ICD-10-CM

## 2019-10-02 NOTE — ED Provider Notes (Signed)
[facility]    CSN: [id number] Arrival date & time: 10/02/19  1526      History   Chief Complaint Chief Complaint  Patient presents with  . Finger Pain    HPI Jayin Derousse is a 39 y.o. male.   HPI  39 year old male the presents with swelling and pain of his left nondominant second finger that started about a week ago.  The finger has been increasing in size and started to drain pus yesterday.  He said he tried to drain the area by probing it about 2 or 3 days ago.  Now the finger is markedly swollen fluctuant and has ascending lymphangitis into his hand and the distal forearm.  He is afebrile.    History reviewed. No pertinent past medical history.  There are no problems to display for this patient.   Past Surgical History:  Procedure Laterality Date  . ELBOW SURGERY         Home Medications    Prior to Admission medications   Medication Sig Start Date End Date Taking? Authorizing Provider  amphetamine-dextroamphetamine (ADDERALL) 20 MG tablet Take 20 mg by mouth 3 (three) times daily.    Yes [provider]    Family History Family History  Problem Relation Age of Onset  . Healthy Mother     Social History Social History   Tobacco Use  . Smoking status: Current Every Day Smoker    Types: Cigarettes  . Smokeless tobacco: Never Used  Vaping Use  . Vaping Use: Never used  Substance Use Topics  . Alcohol use: Yes    Comment: rare  . Drug use: No    Comment: pt denies illegal drug use     Allergies   Patient has no known allergies.   Review of Systems Review of Systems  Constitutional: Positive for activity change. Negative for appetite change, chills, diaphoresis, fatigue and fever.  Skin: Positive for wound.  All other systems reviewed and are negative.    Physical Exam Triage Vital Signs ED Triage Vitals  Enc Vitals Group     BP 10/02/19 1539 (!) 144/99     Pulse Rate 10/02/19 1539 96     Resp 10/02/19 1539 16      Temp 10/02/19 1539 98.2 F (36.8 C)     Temp Source 10/02/19 1539 Oral     SpO2 10/02/19 1539 100 %     Weight 10/02/19 1537 210 lb (95.3 kg)     Height 10/02/19 1537 5\' 11"  (1.803 m)     Head Circumference --      Peak Flow --      Pain Score 10/02/19 1536 10     Pain Loc --      Pain Edu? --      Excl. in [place]? --    No data found.  Updated Vital Signs BP (!) 144/99 (BP Location: Right Arm)   Pulse 96   Temp 98.2 F (36.8 C) (Oral)   Resp 16   Ht 5\' 11"  (1.803 m)   Wt 210 lb (95.3 kg)   SpO2 100%   BMI 29.29 kg/m   Visual Acuity Right Eye Distance:   Left Eye Distance:   Bilateral Distance:    Right Eye Near:   Left Eye Near:    Bilateral Near:     Physical Exam Vitals and nursing note reviewed.  Constitutional:      General: He is not in acute distress.    Appearance:  Normal appearance. He is normal weight. He is not ill-appearing or toxic-appearing.  HENT:     Head: Normocephalic and atraumatic.  Eyes:     Conjunctiva/sclera: Conjunctivae normal.  Musculoskeletal:        General: Swelling, tenderness, deformity and signs of injury present.     Cervical back: Normal range of motion and neck supple.     Comments: Examination of the nondominant left index finger shows a marked swelling particularly over the distal pulp.  There is drainage from the area that is serro purulent.  Is very tender.  Blanchable erythema extending up to past the dorsum of the hand to the distal forearm radially.  Refer to photographs for detail.  Skin:    General: Skin is warm.     Findings: Erythema present.  Neurological:     General: No focal deficit present.     Mental Status: He is alert and oriented to person, place, and time.  Psychiatric:        Mood and Affect: Mood normal.        Behavior: Behavior normal.        Thought Content: Thought content normal.        Judgment: Judgment normal.          UC Treatments / Results  Labs (all labs ordered are listed, but only  abnormal results are displayed) Labs Reviewed - No data to display  EKG   Radiology No results found.  Procedures Procedures (including critical care time)  Medications Ordered in UC Medications - No data to display  Initial Impression / Assessment and Plan / UC Course  I have reviewed the triage vital signs and the nursing notes.  Pertinent labs & imaging results that were available during my care of the patient were reviewed by me and considered in my medical decision making (see chart for details).   39 year old male presented with swelling and pain in his left nondominant second finger that started about a week ago.  It has grown in size tenderness and is draining fluid.  I explained to the patient that this is a bad infection from a felon that has  lymphangitis extending up his hand to his forearm.  He will need to have this evaluated at a emergency department and have drainage surgically as well as antibiotics.  He states he was leaving and going to [facility] for care.  He left our facility in stable condition and will be transported by his girlfriend.   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   Lutricia Feil, PA-C 10/02/19 1620

## 2019-10-02 NOTE — ED Triage Notes (Signed)
Patient c/o swelling and pain in his left 2nd finger that started a week ago.  Patient states that his finger started to drain puss yesterday.  Patient states that he did try poking his finger to get some puss out about 2-3 days ago.

## 2020-08-08 ENCOUNTER — Other Ambulatory Visit: Payer: Self-pay

## 2020-08-08 ENCOUNTER — Ambulatory Visit
Admission: EM | Admit: 2020-08-08 | Discharge: 2020-08-08 | Disposition: A | Discharge status: Home / Self Care | Payer: Self-pay | Attending: Emergency Medicine | Admitting: Emergency Medicine

## 2020-08-08 DIAGNOSIS — H6123 Impacted cerumen, bilateral: Secondary | ICD-10-CM

## 2020-08-08 DIAGNOSIS — R059 Cough, unspecified: Secondary | ICD-10-CM

## 2020-08-08 DIAGNOSIS — J0140 Acute pansinusitis, unspecified: Secondary | ICD-10-CM

## 2020-08-08 HISTORY — DX: Attention-deficit hyperactivity disorder, unspecified type: F90.9

## 2020-08-08 MED ORDER — ALBUTEROL SULFATE HFA 108 (90 BASE) MCG/ACT IN AERS: 1.0000 | INHALATION_SPRAY | RESPIRATORY_TRACT | 0 refills | Status: DC | PRN

## 2020-08-08 MED ORDER — AMOXICILLIN-POT CLAVULANATE 875-125 MG PO TABS: 1.0000 | ORAL_TABLET | Freq: Two times a day (BID) | ORAL | 0 refills | Status: DC

## 2020-08-08 MED ORDER — FLUTICASONE PROPIONATE 50 MCG/ACT NA SUSP: 2.0000 | Freq: Every day | NASAL | 0 refills | Status: DC

## 2020-08-08 MED ORDER — IBUPROFEN 600 MG PO TABS: 600.0000 mg | ORAL_TABLET | Freq: Four times a day (QID) | ORAL | 0 refills | Status: AC | PRN

## 2020-08-08 MED ORDER — AEROCHAMBER PLUS MISC: 2 refills | Status: DC

## 2020-08-08 NOTE — Discharge Instructions (Signed)
Finish the Augmentin.  Saline nasal irrigation with a Lloyd Huger Med rinse and distilled water as often as you want to wash out irritants and infection.  Flonase, 600 mg of ibuprofen combined with 1000 mg of Tylenol together 3-4 times a day as needed for pain, Mucinex D.  2 puffs from your albuterol inhaler using your spacer every 4-6 hours as needed for coughing, wheezing.  Your ears are stopped up with wax.  I would try some Debrox, bulb irrigation with warm water and peroxide.  Here is a list of primary care providers who are taking new patients:  Dr. Elizabeth Sauer [address] [place] [address] [phone]  [facility] at [facility] [address] [place], [place] [address] [phone]  [facility] [place] [address] [place] [address]  [phone]  [facility] [address] [place], [place] [address] [phone]  [facility] [address] [place]  [phone] [place], [place] [address]  Here are clinics/ other resources who will see you if you do not have insurance. Some have certain criteria that you must meet. Call them and find out what they are:  [facility]: [address], [place], [place] [address] Phone: [phone] Hours: First and Third Saturdays of each Month, 9 a.m. - 1 p.m.  [facility]: [address], [address] [place], [place] [address] Phone: [phone] Hours: Tuesday, 4 p.m. - 8 p.m. Thursday, 1 p.m. - 8 p.m. Wednesday, 9 a.m. - [facility] [address] [place], [place] [address] Phone: [phone] Pharmacy Phone Number: [phone] Dental Phone Number: [phone] [facility] Insurance Help: [phone]  Dental Hours: Monday - Thursday, 8 a.m. - 6 p.m.  [facility] [address] [place], [place] [address] Phone: [phone] Pharmacy Phone Number: [phone] [facility] Insurance Help: [phone]  [facility] [address] [place] [place], [place] [address] Phone: [phone] Pharmacy Phone Number: [phone] [facility] Insurance Help: [phone]  [facility] [address] [place], [place] [address] Phone: [phone] [facility] Insurance Help: [phone]   [facility] [address], [place], [place] [address] Phone: [phone]  Go to www.goodrx.com to look up your medications. This will give you a list of where you can find your prescriptions at the most affordable prices. Or ask the pharmacist what the cash price is, or if they have any other discount programs available to help make your medication more affordable. This can be less expensive than what you would pay with insurance.

## 2020-08-08 NOTE — ED Provider Notes (Signed)
HPI  SUBJECTIVE:  Jeffery Aguilar is a 40 y.o. male who presents with 2 months of nasal congestion, postnasal drip, cough secondary to postnasal drip, clear and green rhinorrhea, sinus pain and pressure worse with bending forward and bilateral ear fullness/pressure.  No fevers, body aches, headaches, facial swelling, upper dental pain.  Itchy, watery eyes, sneezing.  No ear pain, change in hearing, otorrhea.  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  He reports a cough starting yesterday.  He has had wheezing and shortness of breath that is unchanged since having COVID 1-1/2 months ago.  He is a smoker.  He took 1000 mg of Tylenol and 600 mg of ibuprofen together, Mucinex, TheraFlu and DayQuil.  He states Tylenol/ ibuprofen, Mucinex and TheraFlu help.  Sinus pain and pressure is worse with bending forward.  He states this "feels like a sinus infection".  He has a past medical history of COVID x2, last infection 1-1/2 months ago.  No history of frequent sinusitis, diabetes, pretension, pulmonary disease.  PMD:: None    Past Medical History:  Diagnosis Date  . ADHD     Past Surgical History:  Procedure Laterality Date  . ELBOW SURGERY      Family History  Problem Relation Age of Onset  . Healthy Mother     Social History   Tobacco Use  . Smoking status: Current Every Day Smoker    Types: Cigarettes  . Smokeless tobacco: Never Used  Vaping Use  . Vaping Use: Never used  Substance Use Topics  . Alcohol use: Yes    Comment: rare  . Drug use: No    Comment: pt denies illegal drug use    No current facility-administered medications for this encounter.  Current Outpatient Medications:  .  albuterol (VENTOLIN HFA) 108 (90 Base) MCG/ACT inhaler, Inhale 1-2 puffs into the lungs every 4 (four) hours as needed for wheezing or shortness of breath., Disp: 1 each, Rfl: 0 .  amoxicillin-clavulanate (AUGMENTIN) 875-125 MG  tablet, Take 1 tablet by mouth 2 (two) times daily. X 7 days, Disp: 14 tablet, Rfl: 0 .  fluticasone (FLONASE) 50 MCG/ACT nasal spray, Place 2 sprays into both nostrils daily., Disp: 16 g, Rfl: 0 .  ibuprofen (ADVIL) 600 MG tablet, Take 1 tablet (600 mg total) by mouth every 6 (six) hours as needed., Disp: 30 tablet, Rfl: 0 .  Spacer/Aero-Holding Chambers (AEROCHAMBER PLUS) inhaler, Use with inhaler, Disp: 1 each, Rfl: 2 .  amphetamine-dextroamphetamine (ADDERALL) 20 MG tablet, Take 20 mg by mouth 3 (three) times daily. , Disp: , Rfl:  .  buPROPion (WELLBUTRIN XL) 300 MG 24 hr tablet, Take 1 tablet by mouth every morning., Disp: , Rfl:   No Known Allergies   ROS  As noted in HPI.   Physical Exam  BP (S) (!) 112/95 (BP Location: Right Arm)   Pulse 98   Temp 97.9 F (36.6 C) (Oral)   Resp 16   Wt 99.8 kg   SpO2 100%   BMI 30.68 kg/m   Constitutional: Well developed, well nourished, no acute distress Eyes:  EOMI, conjunctiva normal bilaterally HENT: Normocephalic, atraumatic,mucus membranes moist.  Bilateral TMs obscured with cerumen.  Able to see small amount of right TM, normal.  Attempted to curette out the left ear canal without complete removal of cerumen.  Unable to visualize TM after curetting.  No nasal congestion, normal turbinates.  No maxillary, frontal  sinus tenderness.  No postnasal drip. Respiratory: Normal inspiratory effort lungs clear bilaterally Cardiovascular: Normal rate, regular rhythm no murmurs rubs or gallops GI: nondistended skin: No rash, skin intact Musculoskeletal: no deformities Neurologic: Alert & oriented x 3, no focal neuro deficits Psychiatric: Speech and behavior appropriate   ED Course   Medications - No data to display  No orders of the defined types were placed in this encounter.   No results found for this or any previous visit (from the past 24 hour(s)). No results found.  ED Clinical Impression  1. Subacute pansinusitis   2.  Cough   3. Bilateral impacted cerumen      ED Assessment/Plan  1.  Sinusitis.  Patient has had symptoms for 2 months and he states that he is getting worse.  We will send him with Augmentin, saline nasal irrigation, Flonase, ibuprofen/Tylenol, Mucinex D.  2.  Cough, wheezing post COVID.  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  Suspect some reactive airway disease.  Will send home with an albuterol inhaler with a spacer.  3.  Bilateral cerumen impaction.  Will have patient start Debrox.  Follow-up with PMD as needed.  Did not repeat COVID testing due to recent COVID infection.  Providing primary care list for ongoing care and will order assistance in finding a PMD  Discussed MDM, treatment plan, and plan for follow-up with patient. . patient agrees with plan.   Meds ordered this encounter  Medications  . fluticasone (FLONASE) 50 MCG/ACT nasal spray    Sig: Place 2 sprays into both nostrils daily.    Dispense:  16 g    Refill:  0  . ibuprofen (ADVIL) 600 MG tablet    Sig: Take 1 tablet (600 mg total) by mouth every 6 (six) hours as needed.    Dispense:  30 tablet    Refill:  0  . amoxicillin-clavulanate (AUGMENTIN) 875-125 MG tablet    Sig: Take 1 tablet by mouth 2 (two) times daily. X 7 days    Dispense:  14 tablet    Refill:  0  . albuterol (VENTOLIN HFA) 108 (90 Base) MCG/ACT inhaler    Sig: Inhale 1-2 puffs into the lungs every 4 (four) hours as needed for wheezing or shortness of breath.    Dispense:  1 each    Refill:  0  . Spacer/Aero-Holding Chambers (AEROCHAMBER PLUS) inhaler    Sig: Use with inhaler    Dispense:  1 each    Refill:  2    Please educate patient on use      *This clinic note was created using Dragon dictation software. Therefore, there may be occasional mistakes despite careful proofreading.  ?    Domenick Gong, MD 08/08/20 [phone]

## 2020-08-08 NOTE — ED Triage Notes (Signed)
Patient presents to Urgent Care with complaints of nasal congestion x 2 months and cough yesterday. Pt tested positive for covid a month ago. Treating symptoms with mucinex and theraflu with little relief. Pt states he is also exp joint inflammation treating discomfort with tylenol and ibuprofen.   Denies fever.

## 2020-09-16 ENCOUNTER — Encounter: Payer: Self-pay | Admitting: Emergency Medicine

## 2020-09-16 ENCOUNTER — Other Ambulatory Visit: Payer: Self-pay

## 2020-09-16 ENCOUNTER — Ambulatory Visit
Admission: EM | Admit: 2020-09-16 | Discharge: 2020-09-16 | Disposition: A | Discharge status: Home / Self Care | Payer: Self-pay | Attending: Sports Medicine | Admitting: Sports Medicine

## 2020-09-16 DIAGNOSIS — H9202 Otalgia, left ear: Secondary | ICD-10-CM

## 2020-09-16 DIAGNOSIS — H6122 Impacted cerumen, left ear: Secondary | ICD-10-CM

## 2020-09-16 NOTE — ED Provider Notes (Signed)
[facility]    CSN: [id number] Arrival date & time: 09/16/20  1114      History   Chief Complaint Chief Complaint  Patient presents with  . Otalgia    left    HPI Jeffery Aguilar is a 40 y.o. male.   Patient is a 40 year old male who presents for evaluation of left ear pain and fullness for the past 2 days.  No fever shakes chills.  He is got a mild decreased hearing in that ear as well.  No issues on the right.  No sore throat or URI symptoms.  No concern for COVID.  No chest pain shortness of breath.  No red flag signs or symptoms elicited on history.      Past Medical History:  Diagnosis Date  . ADHD     There are no problems to display for this patient.   Past Surgical History:  Procedure Laterality Date  . ELBOW SURGERY         Home Medications    Prior to Admission medications   Medication Sig Start Date End Date Taking? Authorizing Provider  amphetamine-dextroamphetamine (ADDERALL) 20 MG tablet Take 20 mg by mouth 3 (three) times daily.   Yes [provider]  buPROPion (WELLBUTRIN XL) 300 MG 24 hr tablet Take 1 tablet by mouth every morning. 07/01/20  Yes [provider]  albuterol (VENTOLIN HFA) 108 (90 Base) MCG/ACT inhaler Inhale 1-2 puffs into the lungs every 4 (four) hours as needed for wheezing or shortness of breath. 08/08/20   Melynda Ripple, MD  amoxicillin-clavulanate (AUGMENTIN) 875-125 MG tablet Take 1 tablet by mouth 2 (two) times daily. X 7 days 08/08/20   Melynda Ripple, MD  fluticasone [facility]) 50 MCG/ACT nasal spray Place 2 sprays into both nostrils daily. 08/08/20   Melynda Ripple, MD  ibuprofen (ADVIL) 600 MG tablet Take 1 tablet (600 mg total) by mouth every 6 (six) hours as needed. 08/08/20   Melynda Ripple, MD  Spacer/Aero-Holding Chambers (AEROCHAMBER PLUS) inhaler Use with inhaler 08/08/20   Melynda Ripple, MD    Family History Family History  Problem Relation Age of Onset  . Healthy Mother      Social History Social History   Tobacco Use  . Smoking status: Current Every Day Smoker    Types: Cigarettes  . Smokeless tobacco: Never Used  Vaping Use  . Vaping Use: Never used  Substance Use Topics  . Alcohol use: Yes    Comment: rare  . Drug use: No    Comment: pt denies illegal drug use     Allergies   Patient has no known allergies.   Review of Systems Review of Systems  Constitutional: Negative for appetite change, chills, diaphoresis, fatigue and fever.  HENT: Positive for ear pain. Negative for congestion, ear discharge, postnasal drip, rhinorrhea, sinus pressure, sinus pain, sneezing and sore throat.   Eyes: Negative for pain.  Respiratory: Negative for cough, chest tightness, shortness of breath and wheezing.   Cardiovascular: Negative for chest pain and palpitations.  Gastrointestinal: Negative for abdominal pain, diarrhea, nausea and vomiting.  Genitourinary: Negative for dysuria.  Musculoskeletal: Negative for back pain, myalgias and neck pain.  Skin: Negative for color change, pallor, rash and wound.  Neurological: Negative for dizziness, light-headedness and headaches.  All other systems reviewed and are negative.    Physical Exam Triage Vital Signs ED Triage Vitals  Enc Vitals Group     BP 09/16/20 1127 (!) 124/101     Pulse Rate  09/16/20 1127 (!) 105     Resp 09/16/20 1127 16     Temp 09/16/20 1127 98 F (36.7 C)     Temp Source 09/16/20 1127 Oral     SpO2 09/16/20 1127 100 %     Weight 09/16/20 1124 220 lb (99.8 kg)     Height 09/16/20 1124 5' 11"  (1.803 m)     Head Circumference --      Peak Flow --      Pain Score 09/16/20 1124 0     Pain Loc --      Pain Edu? --      Excl. in [place]? --    No data found.  Updated Vital Signs BP (!) 124/101 (BP Location: Right Arm)   Pulse (!) 105   Temp 98 F (36.7 C) (Oral)   Resp 16   Ht 5' 11"  (1.803 m)   Wt 99.8 kg   SpO2 100%   BMI 30.68 kg/m   Visual Acuity Right Eye Distance:    Left Eye Distance:   Bilateral Distance:    Right Eye Near:   Left Eye Near:    Bilateral Near:     Physical Exam Vitals and nursing note reviewed.  Constitutional:      General: He is not in acute distress.    Appearance: Normal appearance. He is not ill-appearing, toxic-appearing or diaphoretic.  HENT:     Head: Normocephalic and atraumatic.     Right Ear: Tympanic membrane, ear canal and external ear normal.     Left Ear: There is impacted cerumen.     Nose: Nose normal. No congestion or rhinorrhea.     Mouth/Throat:     Mouth: Mucous membranes are moist.     Pharynx: No oropharyngeal exudate or posterior oropharyngeal erythema.  Eyes:     General: No scleral icterus.       Right eye: No discharge.        Left eye: No discharge.     Conjunctiva/sclera: Conjunctivae normal.     Pupils: Pupils are equal, round, and reactive to light.  Cardiovascular:     Rate and Rhythm: Normal rate and regular rhythm.     Pulses: Normal pulses.     Heart sounds: Normal heart sounds. No murmur heard. No friction rub. No gallop.   Pulmonary:     Effort: Pulmonary effort is normal.     Breath sounds: Normal breath sounds. No stridor. No wheezing, rhonchi or rales.  Musculoskeletal:     Cervical back: Normal range of motion and neck supple.  Skin:    General: Skin is warm and dry.     Capillary Refill: Capillary refill takes less than 2 seconds.  Neurological:     General: No focal deficit present.     Mental Status: He is alert and oriented to person, place, and time.      UC Treatments / Results  Labs (all labs ordered are listed, but only abnormal results are displayed) Labs Reviewed - No data to display  EKG   Radiology No results found.  Procedures Procedures (including critical care time)  Medications Ordered in UC Medications - No data to display  Initial Impression / Assessment and Plan / UC Course  I have reviewed the triage vital signs and the nursing  notes.  Pertinent labs & imaging results that were available during my care of the patient were reviewed by me and considered in my medical decision making (see chart for details).  Clinical impression: Left ear pain and fullness with mild decreased hearing and impacted cerumen.  Treatment plan: 1.  The findings and treatment plan were discussed in detail with the patient.  Patient was in agreement. 2.  Recommended earwax irrigation.  This was performed by the nursing staff.  It was successful.  His hearing was much better, his symptoms had resolved.  He had no evidence of otitis media or any irritation of the canal or otitis externa. 3.  Educational handouts provided. 4.  I have asked him to purchase over-the-counter earwax removal kit and just treat both ears moving forward. 5.  He does not have a primary care physician would like to establish care.  Put in a referral to get assistance with. 6.  Supportive care, over-the-counter meds as needed. 7.  He was discharged in stable condition he will follow-up here as needed.    Final Clinical Impressions(s) / UC Diagnoses   Final diagnoses:  Impacted cerumen of left ear  Otalgia of left ear     Discharge Instructions     As we discussed, you had impacted earwax.  He had significant improvement in her symptoms once we were able to get that removed. You do not have an ear infection so no antibiotics. Please see educational handouts. You can purchase over-the-counter earwax removal or mineral oil and cotton swabs and try to soften the earwax and little follow-up. I put a referral in for you to establish care with a primary care physician. Regarding her multiple joint complaints I would recommend that he contact a local orthopedic group or wait until you are seen by your primary care provider and see if they would refer you or choose to treat you.    ED Prescriptions    None     PDMP not reviewed this encounter.   Verda Cumins,  MD 09/16/20 1257

## 2020-09-16 NOTE — ED Triage Notes (Signed)
Patient reports fullness in his left ear for the past 2 days.  Patient denies fevers.

## 2020-09-16 NOTE — Discharge Instructions (Signed)
As we discussed, you had impacted earwax.  He had significant improvement in her symptoms once we were able to get that removed. You do not have an ear infection so no antibiotics. Please see educational handouts. You can purchase over-the-counter earwax removal or mineral oil and cotton swabs and try to soften the earwax and little follow-up. I put a referral in for you to establish care with a primary care physician. Regarding her multiple joint complaints I would recommend that he contact a local orthopedic group or wait until you are seen by your primary care provider and see if they would refer you or choose to treat you.

## 2020-10-03 ENCOUNTER — Ambulatory Visit
Admission: EM | Admit: 2020-10-03 | Discharge: 2020-10-03 | Disposition: A | Discharge status: Home / Self Care | Payer: Self-pay | Attending: Sports Medicine | Admitting: Sports Medicine

## 2020-10-03 ENCOUNTER — Other Ambulatory Visit: Payer: Self-pay

## 2020-10-03 DIAGNOSIS — B354 Tinea corporis: Secondary | ICD-10-CM

## 2020-10-03 DIAGNOSIS — M67442 Ganglion, left hand: Secondary | ICD-10-CM

## 2020-10-03 DIAGNOSIS — B351 Tinea unguium: Secondary | ICD-10-CM

## 2020-10-03 MED ORDER — TERBINAFINE HCL 1 % EX CREA: 1.0000 "application " | TOPICAL_CREAM | Freq: Every day | CUTANEOUS | 1 refills | Status: DC

## 2020-10-03 NOTE — Discharge Instructions (Addendum)
For your ringworm:  Apply the terbinafine cream to all of your lesions once daily for at least a week but no more than 4 weeks.  I have made a referral to [facility] for you for further evaluation of your ringworm and also for treatment of your toenail fungus.  For your ganglion cyst:  Follow-up with [facility], they have an urgent care that can address this issue open Monday through Friday.  For your joint pain and swelling:  I have made a referral to help you find a primary care doctor who can do a full work-up and evaluation as to the cause of your joint pain and swelling.  This is not something that we can evaluate, treat, or manage in the urgent care.

## 2020-10-03 NOTE — ED Provider Notes (Signed)
[facility]    CSN: [id number] Arrival date & time: 10/03/20  1621      History   Chief Complaint Chief Complaint  Patient presents with   Nail Problem    HPI Jeffery Aguilar is a 40 y.o. male.   HPI  40 year old male here for evaluation of multiple complaints.  Patient's first complaint is that he has had a circular itchy rash on his body for over a year.  He does not have a primary care doctor and has not been to see a dermatologist about this.  His second complaint is that he has toenail fungus that has been going on for the same length of time and he would like that addressed.  His third complaint is that he is complaining of his joint swelling and being painful every morning to the point rest take ibuprofen.  He is also complaining of a lump on the back of his left wrist that has been present for an unknown length of time but it does cause him pain when he uses his wrist a lot.  Past Medical History:  Diagnosis Date   ADHD     There are no problems to display for this patient.   Past Surgical History:  Procedure Laterality Date   ELBOW SURGERY         Home Medications    Prior to Admission medications   Medication Sig Start Date End Date Taking? Authorizing Provider  albuterol (VENTOLIN HFA) 108 (90 Base) MCG/ACT inhaler Inhale 1-2 puffs into the lungs every 4 (four) hours as needed for wheezing or shortness of breath. 08/08/20  Yes Domenick Gong, MD  amphetamine-dextroamphetamine (ADDERALL) 20 MG tablet Take 20 mg by mouth 3 (three) times daily.   Yes [provider]  buPROPion (WELLBUTRIN XL) 300 MG 24 hr tablet Take 1 tablet by mouth every morning. 07/01/20  Yes [provider]  ibuprofen (ADVIL) 600 MG tablet Take 1 tablet (600 mg total) by mouth every 6 (six) hours as needed. 08/08/20  Yes Domenick Gong, MD  Spacer/Aero-Holding Chambers (AEROCHAMBER PLUS) inhaler Use with inhaler 08/08/20  Yes Domenick Gong, MD   terbinafine (LAMISIL) 1 % cream Apply 1 application topically daily. 10/03/20  Yes Becky Augusta, NP    Family History Family History  Problem Relation Age of Onset   Healthy Mother     Social History Social History   Tobacco Use   Smoking status: Every Day    Pack years: 0.00    Types: Cigarettes   Smokeless tobacco: Never  Vaping Use   Vaping Use: Never used  Substance Use Topics   Alcohol use: Yes    Comment: rare   Drug use: No    Comment: pt denies illegal drug use     Allergies   Patient has no known allergies.   Review of Systems Review of Systems  Constitutional:  Negative for activity change, appetite change and fever.  Musculoskeletal:  Positive for arthralgias.  Skin:  Positive for color change and rash.    Physical Exam Triage Vital Signs ED Triage Vitals  Enc Vitals Group     BP 10/03/20 1710 (!) 131/94     Pulse Rate 10/03/20 1710 (!) 105     Resp 10/03/20 1710 14     Temp 10/03/20 1710 98.1 F (36.7 C)     Temp Source 10/03/20 1710 Oral     SpO2 10/03/20 1710 99 %     Weight 10/03/20 1709 220 lb (99.8  kg)     Height 10/03/20 1709 5\' 11"  (1.803 m)     Head Circumference --      Peak Flow --      Pain Score 10/03/20 1708 5     Pain Loc --      Pain Edu? --      Excl. in [place]? --    No data found.  Updated Vital Signs BP (!) 131/94 (BP Location: Right Arm)   Pulse (!) 105   Temp 98.1 F (36.7 C) (Oral)   Resp 14   Ht 5\' 11"  (1.803 m)   Wt 220 lb (99.8 kg)   SpO2 99%   BMI 30.68 kg/m   Visual Acuity Right Eye Distance:   Left Eye Distance:   Bilateral Distance:    Right Eye Near:   Left Eye Near:    Bilateral Near:     Physical Exam Vitals and nursing note reviewed.  Constitutional:      General: He is not in acute distress.    Appearance: Normal appearance. He is not ill-appearing.  HENT:     Head: Normocephalic and atraumatic.  Musculoskeletal:        General: Swelling and tenderness present. No deformity or signs of  injury. Normal range of motion.  Skin:    General: Skin is warm and dry.     Capillary Refill: Capillary refill takes less than 2 seconds.     Findings: Rash present.  Neurological:     General: No focal deficit present.     Mental Status: He is alert and oriented to person, place, and time.  Psychiatric:        Mood and Affect: Mood normal.        Behavior: Behavior normal.        Thought Content: Thought content normal.        Judgment: Judgment normal.     UC Treatments / Results  Labs (all labs ordered are listed, but only abnormal results are displayed) Labs Reviewed - No data to display  EKG   Radiology No results found.  Procedures Procedures (including critical care time)  Medications Ordered in UC Medications - No data to display  Initial Impression / Assessment and Plan / UC Course  I have reviewed the triage vital signs and the nursing notes.  Pertinent labs & imaging results that were available during my care of the patient were reviewed by me and considered in my medical decision making (see chart for details).  Patient is a nontoxic-appearing 40 year old male here for evaluation of a disseminated rash on his body that he thinks is fungal, joint pain, toenail fungus, and a swollen knot on the back of his left hand as outlined in the HPI.  Patient's physical exam reveals raised circular red lesions with central clearing disseminated across both lower extremities that are consistent with ringworm.  Patient also has thick yellow toenails on his right foot that are consistent with onychomycosis.  Additionally, patient has a swollen area on the volar aspect of his left wrist at the carpal radial junction that is consistent with a ganglion cyst.  Explained to patient that I am unable to run labs so I cannot start him on long-term therapy for oral antifungals, nor can I monitor his therapy.  Will treat his ringworm with topical terbinafine and refer him to dermatology  for further management of his toenail fungus and to ensure clearing of his ringworm.  I will also make a referral  to help him find a primary care doctor with whom he can discuss his joint pain and swelling and have the proper blood work and evaluation performed with someone who can follow-up and manage his care.  I have also suggested that he see [facility] for the ganglion cyst on the back part of his left wrist.   Final Clinical Impressions(s) / UC Diagnoses   Final diagnoses:  Ringworm of body  Toenail fungus  Ganglion cyst of tendon sheath of left hand   Discharge Instructions   None    ED Prescriptions     Medication Sig Dispense Auth. Provider   terbinafine (LAMISIL) 1 % cream Apply 1 application topically daily. 42 g Becky Augusta, NP      PDMP not reviewed this encounter.   Becky Augusta, NP 10/03/20 1806

## 2020-10-03 NOTE — ED Triage Notes (Signed)
Patient complains of fungal like rash on his body x 1 year.   Patient also complains of mutliple lumps on his body (right arm, right hand) that he would like to get check out. Patient states that he has joint pain as well.

## 2020-10-13 ENCOUNTER — Encounter: Payer: Self-pay | Admitting: Emergency Medicine

## 2020-10-13 ENCOUNTER — Other Ambulatory Visit: Payer: Self-pay

## 2020-10-13 ENCOUNTER — Ambulatory Visit
Admission: EM | Admit: 2020-10-13 | Discharge: 2020-10-13 | Disposition: A | Discharge status: Home / Self Care | Payer: Self-pay | Attending: Sports Medicine | Admitting: Sports Medicine

## 2020-10-13 DIAGNOSIS — R059 Cough, unspecified: Secondary | ICD-10-CM

## 2020-10-13 DIAGNOSIS — R062 Wheezing: Secondary | ICD-10-CM

## 2020-10-13 DIAGNOSIS — R600 Localized edema: Secondary | ICD-10-CM

## 2020-10-13 DIAGNOSIS — Z720 Tobacco use: Secondary | ICD-10-CM

## 2020-10-13 MED ORDER — ALBUTEROL SULFATE HFA 108 (90 BASE) MCG/ACT IN AERS: 1.0000 | INHALATION_SPRAY | Freq: Four times a day (QID) | RESPIRATORY_TRACT | 0 refills | Status: DC | PRN

## 2020-10-13 MED ORDER — PREDNISONE 10 MG (21) PO TBPK: ORAL_TABLET | Freq: Every day | ORAL | 0 refills | Status: DC

## 2020-10-13 MED ORDER — BENZONATATE 100 MG PO CAPS: 200.0000 mg | ORAL_CAPSULE | Freq: Three times a day (TID) | ORAL | 0 refills | Status: DC | PRN

## 2020-10-13 NOTE — Discharge Instructions (Addendum)
As we discussed, you really do need to get a primary care provider.  You need to start calling around and looking online to see who is accepting new patients.  I would start within the [facility] system. You should also call the [facility] dermatology department so that you can be seen for your ringworm. I prescribed steroids, a cough medicine, and renewed your albuterol for your cough and wheeze. Try to elevate your legs is much as possible for the swelling. Please see educational handouts. If your cough, wheeze, shortness of breath, or leg swelling gets worse you need to go to the ER.

## 2020-10-13 NOTE — ED Triage Notes (Signed)
Patient c/o dry cough for the past 3 days.  Patient denies fevers.  Patient reports SOB off and on.  Patient states that he had COVID in Feb of this year.  Patient also reports swelling in his ankles that started couple of days ago.  Patient states that he has not established with a PCP.

## 2020-10-13 NOTE — ED Provider Notes (Signed)
[facility]    CSN: [id number] Arrival date & time: 10/13/20  1625      History   Chief Complaint Chief Complaint  Patient presents with   Cough   Leg Swelling    HPI Jeffery Aguilar is a 40 y.o. male.   40 year old male who presents for evaluation of cough and wheeze for 3 days.  He said some intermittent shortness of breath.  He reports that if he lays back he coughs a lot so he is having a hard time sleeping.  He also reports having some chronic intermittent bilateral lower extremity ankle swelling.  Is been worse the last 3 days because he cannot lay flat and elevate his legs.  He denies any calf pain.  He does not have a primary care physician.  He works in [employer].  No fever shakes chills.  No nausea vomiting or diarrhea.  He has been seen on 4 occasions over the past 2 months for various ailments.  Was recently placed on Lamisil for a ringworm infection.  Thinks it is improving a little bit.  No side effects from this medication.  No exposure to COVID or influenza.  He denies any asthma history.  He was given an inhaler back in April but it is finished.  No rhinorrhea, nasal congestion, ear pain or URI symptoms.  No substernal chest pain.  No calf pain or history of blood clots in the past.  No red flag signs or symptoms elicited on history.  Of note he does have a history of tobacco abuse.   Past Medical History:  Diagnosis Date   ADHD     There are no problems to display for this patient.   Past Surgical History:  Procedure Laterality Date   ELBOW SURGERY         Home Medications    Prior to Admission medications   Medication Sig Start Date End Date Taking? Authorizing Provider  albuterol (VENTOLIN HFA) 108 (90 Base) MCG/ACT inhaler Inhale 1-2 puffs into the lungs every 6 (six) hours as needed for wheezing or shortness of breath. 10/13/20  Yes Delton See, MD  amphetamine-dextroamphetamine (ADDERALL) 20 MG tablet Take 20 mg by  mouth 3 (three) times daily.   Yes [provider]  benzonatate (TESSALON) 100 MG capsule Take 2 capsules (200 mg total) by mouth 3 (three) times daily as needed. 10/13/20  Yes Delton See, MD  buPROPion (WELLBUTRIN XL) 300 MG 24 hr tablet Take 1 tablet by mouth every morning. 07/01/20  Yes [provider]  predniSONE (STERAPRED UNI-PAK 21 TAB) 10 MG (21) TBPK tablet Take by mouth daily. Take 6 tabs by mouth daily  for 2 days, then 5 tabs for 2 days, then 4 tabs for 2 days, then 3 tabs for 2 days, 2 tabs for 2 days, then 1 tab by mouth daily for 2 days 10/13/20  Yes Delton See, MD  ibuprofen (ADVIL) 600 MG tablet Take 1 tablet (600 mg total) by mouth every 6 (six) hours as needed. 08/08/20   Domenick Gong, MD  Spacer/Aero-Holding Chambers (AEROCHAMBER PLUS) inhaler Use with inhaler 08/08/20   Domenick Gong, MD  terbinafine (LAMISIL) 1 % cream Apply 1 application topically daily. 10/03/20   Becky Augusta, NP    Family History Family History  Problem Relation Age of Onset   Healthy Mother     Social History Social History   Tobacco Use   Smoking status: Every Day    Pack years:  0.00    Types: Cigarettes   Smokeless tobacco: Never  Vaping Use   Vaping Use: Never used  Substance Use Topics   Alcohol use: Yes    Comment: rare   Drug use: No    Comment: pt denies illegal drug use     Allergies   Patient has no known allergies.   Review of Systems Review of Systems  Constitutional:  Negative for activity change, appetite change, chills, diaphoresis, fatigue and fever.  HENT:  Negative for congestion, ear pain, postnasal drip, rhinorrhea, sinus pressure, sinus pain, sneezing and sore throat.   Eyes:  Negative for pain.  Respiratory:  Positive for cough, shortness of breath and wheezing.   Cardiovascular:  Positive for leg swelling. Negative for chest pain and palpitations.  Gastrointestinal:  Negative for abdominal pain, diarrhea, nausea and vomiting.   Genitourinary:  Negative for dysuria.  Musculoskeletal:  Negative for back pain, myalgias and neck pain.  Neurological:  Negative for dizziness, light-headedness and headaches.  All other systems reviewed and are negative.   Physical Exam Triage Vital Signs ED Triage Vitals  Enc Vitals Group     BP 10/13/20 1656 (!) 127/92     Pulse Rate 10/13/20 1656 (!) 108     Resp 10/13/20 1656 16     Temp 10/13/20 1656 98.4 F (36.9 C)     Temp Source 10/13/20 1656 Oral     SpO2 10/13/20 1656 98 %     Weight 10/13/20 1655 220 lb 0.3 oz (99.8 kg)     Height 10/13/20 1655 5\' 11"  (1.803 m)     Head Circumference --      Peak Flow --      Pain Score 10/13/20 1654 0     Pain Loc --      Pain Edu? --      Excl. in [place]? --    No data found.  Updated Vital Signs BP (!) 127/92 (BP Location: Left Arm)   Pulse (!) 108   Temp 98.4 F (36.9 C) (Oral)   Resp 16   Ht 5\' 11"  (1.803 m)   Wt 99.8 kg   SpO2 98%   BMI 30.69 kg/m   Visual Acuity Right Eye Distance:   Left Eye Distance:   Bilateral Distance:    Right Eye Near:   Left Eye Near:    Bilateral Near:     Physical Exam Vitals and nursing note reviewed.  Constitutional:      General: He is not in acute distress.    Appearance: Normal appearance. He is not ill-appearing, toxic-appearing or diaphoretic.  HENT:     Head: Normocephalic and atraumatic.     Nose: Nose normal.     Mouth/Throat:     Mouth: Mucous membranes are moist.  Eyes:     General: No scleral icterus.    Conjunctiva/sclera: Conjunctivae normal.     Pupils: Pupils are equal, round, and reactive to light.  Cardiovascular:     Rate and Rhythm: Normal rate and regular rhythm.     Chest Wall: PMI is not displaced. No thrill.     Pulses: Normal pulses.          Carotid pulses are 2+ on the right side and 2+ on the left side.      Radial pulses are 2+ on the right side and 2+ on the left side.       Femoral pulses are 2+ on the right side and 2+ on the  left  side.      Popliteal pulses are 2+ on the right side and 2+ on the left side.       Dorsalis pedis pulses are 2+ on the right side and 2+ on the left side.       Posterior tibial pulses are 2+ on the right side and 2+ on the left side.     Heart sounds: Normal heart sounds. No murmur heard.   No friction rub. No gallop.     Comments: Recheck heart rate was 94 bpm Pulmonary:     Effort: Pulmonary effort is normal. No respiratory distress.     Breath sounds: No stridor. Wheezing present. No rhonchi or rales.  Musculoskeletal:     Cervical back: Normal range of motion and neck supple.     Right lower leg: 1+ Edema present.     Left lower leg: 1+ Edema present.  Lymphadenopathy:     Comments: Patient has trace to 1+ pitting edema bilateral lower extremities just around the ankle and about 2 cm proximal to the medial malleolus.  [place] and [place] tests are negative.  Has full active range of motion of the ankle.  Skin:    General: Skin is warm and dry.     Capillary Refill: Capillary refill takes less than 2 seconds.  Neurological:     General: No focal deficit present.     Mental Status: He is alert and oriented to person, place, and time.     UC Treatments / Results  Labs (all labs ordered are listed, but only abnormal results are displayed) Labs Reviewed - No data to display  EKG   Radiology No results found.  Procedures Procedures (including critical care time)  Medications Ordered in UC Medications - No data to display  Initial Impression / Assessment and Plan / UC Course  I have reviewed the triage vital signs and the nursing notes.  Pertinent labs & imaging results that were available during my care of the patient were reviewed by me and considered in my medical decision making (see chart for details).  Clinical impression: 1.  Cough and wheeze for 3 days.  He is 98% on room air.  No evidence of any crackles on auscultation. 2.  Bilateral 1+ ankle edema for 3 days  since he had a cough and wheeze.  He does have intermittent symptoms of this but he cannot elevate his legs because when he lays flat his cough gets worse. 3.  History of tobacco abuse. 4.  Ringworm currently being treated with Lamisil and it is seeing some improvement.  He was unable to get into dermatology. 5.  Inability to establish care with a primary care provider.  Treatment plan: 1.  The findings and treatment plan were discussed in detail with the patient.  Patient was in agreement. 2.  I went ahead and renewed his albuterol inhaler. 3.  We will go ahead and put him on a prednisone taper for his wheeze. 4.  Gave him Tessalon Perles for his cough.  Given that he is on Effexor 300 mg a day there was limitations in what I could prescribe so the Tessalon Perles was a good choice. 5.  Educational handouts provided. 6.  Continue with the Lamisil for his ringworm. 7.  Encouraged him once again to obtain a primary care provider to deal with all of his medical conditions. 8.  If his lower extremity swelling, wheezing, shortness of breath was to worsen in any way  he needs to go to the ER.  He voiced verbal understanding. 9.  He was stable on discharge and he will follow-up here as needed.    Final Clinical Impressions(s) / UC Diagnoses   Final diagnoses:  Cough  Wheeze  Bilateral lower extremity edema  Tobacco abuse     Discharge Instructions      As we discussed, you really do need to get a primary care provider.  You need to start calling around and looking online to see who is accepting new patients.  I would start within the [facility] system. You should also call the [facility] dermatology department so that you can be seen for your ringworm. I prescribed steroids, a cough medicine, and renewed your albuterol for your cough and wheeze. Try to elevate your legs is much as possible for the swelling. Please see educational handouts. If your cough, wheeze, shortness of breath, or leg swelling  gets worse you need to go to the ER.     ED Prescriptions     Medication Sig Dispense Auth. Provider   albuterol (VENTOLIN HFA) 108 (90 Base) MCG/ACT inhaler Inhale 1-2 puffs into the lungs every 6 (six) hours as needed for wheezing or shortness of breath. 1 each Delton See, MD   predniSONE (STERAPRED UNI-PAK 21 TAB) 10 MG (21) TBPK tablet Take by mouth daily. Take 6 tabs by mouth daily  for 2 days, then 5 tabs for 2 days, then 4 tabs for 2 days, then 3 tabs for 2 days, 2 tabs for 2 days, then 1 tab by mouth daily for 2 days 42 tablet Delton See, MD   benzonatate (TESSALON) 100 MG capsule Take 2 capsules (200 mg total) by mouth 3 (three) times daily as needed. 30 capsule Delton See, MD      PDMP not reviewed this encounter.   Delton See, MD 10/13/20 [id number]

## 2020-10-27 ENCOUNTER — Telehealth: Payer: Self-pay

## 2020-10-27 NOTE — Telephone Encounter (Signed)
Left message to call back to schedule new patient appointment.

## 2020-10-31 DIAGNOSIS — J302 Other seasonal allergic rhinitis: Secondary | ICD-10-CM | POA: Insufficient documentation

## 2020-10-31 DIAGNOSIS — F325 Major depressive disorder, single episode, in full remission: Secondary | ICD-10-CM | POA: Insufficient documentation

## 2021-01-08 DIAGNOSIS — M25532 Pain in left wrist: Secondary | ICD-10-CM | POA: Insufficient documentation

## 2021-01-08 DIAGNOSIS — M25432 Effusion, left wrist: Secondary | ICD-10-CM | POA: Insufficient documentation

## 2021-01-10 DIAGNOSIS — M2550 Pain in unspecified joint: Secondary | ICD-10-CM | POA: Insufficient documentation

## 2021-03-22 DIAGNOSIS — L0390 Cellulitis, unspecified: Secondary | ICD-10-CM | POA: Insufficient documentation

## 2021-03-28 DIAGNOSIS — L03114 Cellulitis of left upper limb: Secondary | ICD-10-CM | POA: Insufficient documentation

## 2021-05-22 DIAGNOSIS — Z79899 Other long term (current) drug therapy: Secondary | ICD-10-CM | POA: Insufficient documentation

## 2021-05-22 DIAGNOSIS — M1A09X1 Idiopathic chronic gout, multiple sites, with tophus (tophi): Secondary | ICD-10-CM | POA: Insufficient documentation

## 2021-09-13 ENCOUNTER — Ambulatory Visit
Admission: EM | Admit: 2021-09-13 | Discharge: 2021-09-13 | Disposition: A | Discharge status: Home / Self Care | Payer: Commercial Managed Care - PPO

## 2021-09-13 ENCOUNTER — Other Ambulatory Visit: Payer: Self-pay

## 2021-09-13 DIAGNOSIS — B350 Tinea barbae and tinea capitis: Secondary | ICD-10-CM

## 2021-09-13 DIAGNOSIS — R21 Rash and other nonspecific skin eruption: Secondary | ICD-10-CM | POA: Diagnosis not present

## 2021-09-13 DIAGNOSIS — L259 Unspecified contact dermatitis, unspecified cause: Secondary | ICD-10-CM

## 2021-09-13 MED ORDER — CLOTRIMAZOLE-BETAMETHASONE 1-0.05 % EX CREA: TOPICAL_CREAM | CUTANEOUS | 0 refills | Status: DC

## 2021-09-13 MED ORDER — PREDNISONE 10 MG (21) PO TBPK: ORAL_TABLET | Freq: Every day | ORAL | 0 refills | Status: DC

## 2021-09-13 NOTE — ED Provider Notes (Signed)
[facility]    CSN: [id number] Arrival date & time: 09/13/21  1037      History   Chief Complaint Chief Complaint  Patient presents with   Rash    HPI Jeffery Aguilar is a 41 y.o. male.   Patient presents today with a rash that began on his legs and has started to extend up to his thighs for the past 10 days now.  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  Patient applied a cream and it went away.  Patient denies any new soaps lotions or medications.  Denies any pain she just states that it is an itching feeling.  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   Encounter for long-term (current) use of high-risk medication 05/22/2021   Cellulitis of left elbow 03/28/2021   Cellulitis 03/22/2021   Multiple joint pain 01/10/2021   Swelling of joint of left wrist 01/08/2021   Left wrist pain 01/08/2021   Seasonal allergies 10/31/2020   Major depressive disorder with single episode, in full remission ([place]) 10/31/2020   Attention deficit hyperactivity disorder (ADHD) 03/15/2013    Past Surgical History:  Procedure Laterality Date   ELBOW SURGERY         Home Medications    Prior to Admission medications   Medication Sig Start Date End Date Taking? Authorizing Provider  albuterol (VENTOLIN HFA) 108 (90 Base) MCG/ACT inhaler Inhale 1-2 puffs into the lungs every 6 (six) hours as needed for wheezing or shortness of breath. 10/13/20  Yes Verda Cumins, MD  allopurinol (ZYLOPRIM) 300 MG tablet Take by mouth. 06/19/21  Yes [provider]  ALPRAZolam Duanne Moron) 1 MG tablet Take 1 tablet by mouth daily as needed. 08/14/15  Yes [provider]  amphetamine-dextroamphetamine (ADDERALL) 20 MG tablet Take 20 mg by mouth 3 (three) times  daily.   Yes [provider]  buPROPion (WELLBUTRIN XL) 150 MG 24 hr tablet  05/09/20  Yes [provider]  clotrimazole-betamethasone (LOTRISONE) cream Apply to affected area 2 times daily prn 09/13/21  Yes Morley Kos L, NP  Colchicine 0.6 MG CAPS colchicine 0.6 mg capsule  Take 1 capsule every day by oral route.   Yes [provider]  colchicine 0.6 MG tablet Take by mouth. 05/22/21  Yes [provider]  ibuprofen (ADVIL) 600 MG tablet Take 1 tablet (600 mg total) by mouth every 6 (six) hours as needed. 08/08/20  Yes Melynda Ripple, MD  levocetirizine (XYZAL) 5 MG tablet Take by mouth.   Yes [provider]  predniSONE (STERAPRED UNI-PAK 21 TAB) 10 MG (21) TBPK tablet Take by mouth daily. Take 6 tabs by mouth daily  for 2 days, then 5 tabs for 2 days, then 4 tabs for 2 days, then 3 tabs for 2 days, 2 tabs for 2 days, then 1 tab by mouth daily for 2 days 09/13/21  Yes Marney Setting, NP  Spacer/Aero-Holding Chambers (AEROCHAMBER PLUS) inhaler Use with inhaler 08/08/20  Yes Melynda Ripple, MD  Spacer/Aero-Holding Chambers (AEROCHAMBER W/FLOWSIGNAL) inhaler See admin instructions. 08/08/20  Yes [provider]  terbinafine (LAMISIL) 1 % cream Apply 1 application topically daily. 10/03/20  Yes Margarette Canada, NP  benzonatate (TESSALON) 100 MG capsule Take 2 capsules (  200 mg total) by mouth 3 (three) times daily as needed. 10/13/20   Verda Cumins, MD  buPROPion (WELLBUTRIN XL) 300 MG 24 hr tablet Take 1 tablet by mouth every morning. 07/01/20   [provider]  predniSONE (STERAPRED UNI-PAK 21 TAB) 10 MG (21) TBPK tablet Take by mouth daily. Take 6 tabs by mouth daily  for 2 days, then 5 tabs for 2 days, then 4 tabs for 2 days, then 3 tabs for 2 days, 2 tabs for 2 days, then 1 tab by mouth daily for 2 days 10/13/20   Verda Cumins, MD  sulfamethoxazole-trimethoprim (BACTRIM DS) 800-160 MG tablet sulfamethoxazole 800 mg-trimethoprim  160 mg tablet  TAKE 1 TABLET BY MOUTH EVERY 12 HOURS FOR 10 DAYS    [provider]  UNABLE TO FIND Adderall    [provider]  UNABLE TO FIND No Medications Reported    [provider]    Family History Family History  Problem Relation Age of Onset   Healthy Mother     Social History Social History   Tobacco Use   Smoking status: Every Day    Types: Cigarettes   Smokeless tobacco: Never  Vaping Use   Vaping Use: Never used  Substance Use Topics   Alcohol use: Yes    Comment: rare   Drug use: No    Comment: pt denies illegal drug use     Allergies   Patient has no known allergies.   Review of Systems Review of Systems  Constitutional:  Negative for fatigue and fever.  Respiratory: Negative.    Cardiovascular: Negative.   Gastrointestinal: Negative.   Skin:  Positive for rash.    Physical Exam Triage Vital Signs ED Triage Vitals  Enc Vitals Group     BP 09/13/21 1151 107/83     Pulse Rate 09/13/21 1151 93     Resp 09/13/21 1151 18     Temp 09/13/21 1151 97.7 F (36.5 C)     Temp Source 09/13/21 1151 Oral     SpO2 09/13/21 1151 96 %     Weight 09/13/21 1149 230 lb (104.3 kg)     Height 09/13/21 1149 5\' 11"  (1.803 m)     Head Circumference --      Peak Flow --      Pain Score 09/13/21 1149 0     Pain Loc --      Pain Edu? --      Excl. in [place]? --    No data found.  Updated Vital Signs BP 107/83 (BP Location: Left Arm)   Pulse 93   Temp 97.7 F (36.5 C) (Oral)   Resp 18   Ht 5\' 11"  (1.803 m)   Wt 230 lb (104.3 kg)   SpO2 96%   BMI 32.08 kg/m   Visual Acuity Right Eye Distance:   Left Eye Distance:   Bilateral Distance:    Right Eye Near:   Left Eye Near:    Bilateral Near:     Physical Exam Constitutional:      Appearance: Normal appearance.  Cardiovascular:     Rate and Rhythm: Normal rate.  Pulmonary:     Effort: Pulmonary effort is normal.  Abdominal:     General: Abdomen is flat.  Skin:     Capillary Refill: Capillary refill takes less than 2 seconds.     Findings: Erythema and rash present.  Neurological:     General: No focal deficit present.  Mental Status: He is alert.     UC Treatments / Results  Labs (all labs ordered are listed, but only abnormal results are displayed) Labs Reviewed - No data to display  EKG   Radiology No results found.  Procedures Procedures (including critical care time)  Medications Ordered in UC Medications - No data to display  Initial Impression / Assessment and Plan / UC Course  I have reviewed the triage vital signs and the nursing notes.  Pertinent labs & imaging results that were available during my care of the patient were reviewed by me and considered in my medical decision making (see chart for details).     Take medication as prescribed Sure to wash hands well after applying cream this may take several days to go away  Final Clinical Impressions(s) / UC Diagnoses   Final diagnoses:  Rash  Contact dermatitis, unspecified contact dermatitis type, unspecified trigger  Tinea capitis   Discharge Instructions   None    ED Prescriptions     Medication Sig Dispense Auth. Provider   predniSONE (STERAPRED UNI-PAK 21 TAB) 10 MG (21) TBPK tablet Take by mouth daily. Take 6 tabs by mouth daily  for 2 days, then 5 tabs for 2 days, then 4 tabs for 2 days, then 3 tabs for 2 days, 2 tabs for 2 days, then 1 tab by mouth daily for 2 days 42 tablet Morley Kos L, NP   clotrimazole-betamethasone (LOTRISONE) cream Apply to affected area 2 times daily prn 15 g Marney Setting, NP      PDMP not reviewed this encounter.   Marney Setting, NP 09/13/21 1251

## 2021-09-13 NOTE — ED Triage Notes (Signed)
Patient c.o "ring worn rash" started on left leg now its getting worse and spreading.   Started about 10 days ago.

## 2021-11-19 ENCOUNTER — Ambulatory Visit
Admission: EM | Admit: 2021-11-19 | Discharge: 2021-11-19 | Disposition: A | Discharge status: Home / Self Care | Payer: Commercial Managed Care - PPO | Attending: Physician Assistant | Admitting: Physician Assistant

## 2021-11-19 ENCOUNTER — Encounter: Payer: Self-pay | Admitting: Emergency Medicine

## 2021-11-19 DIAGNOSIS — R0602 Shortness of breath: Secondary | ICD-10-CM

## 2021-11-19 DIAGNOSIS — R21 Rash and other nonspecific skin eruption: Secondary | ICD-10-CM | POA: Diagnosis not present

## 2021-11-19 DIAGNOSIS — K1379 Other lesions of oral mucosa: Secondary | ICD-10-CM | POA: Diagnosis not present

## 2021-11-19 MED ORDER — ALBUTEROL SULFATE HFA 108 (90 BASE) MCG/ACT IN AERS: 1.0000 | INHALATION_SPRAY | Freq: Four times a day (QID) | RESPIRATORY_TRACT | 0 refills | Status: AC | PRN

## 2021-11-19 MED ORDER — PREDNISONE 20 MG PO TABS: 40.0000 mg | ORAL_TABLET | Freq: Every day | ORAL | 0 refills | Status: AC

## 2021-11-19 MED ORDER — CLOTRIMAZOLE-BETAMETHASONE 1-0.05 % EX CREA: TOPICAL_CREAM | CUTANEOUS | 1 refills | Status: AC

## 2021-11-19 NOTE — ED Triage Notes (Signed)
Pt c/o of SOB x 3 days its worse when he's sitting still and better with exertion.   Pt also has a rash on his right shin x 3 days

## 2021-11-19 NOTE — Discharge Instructions (Addendum)
The office-I have sent a cream to the pharmacy for the rash.  Hopefully this helps.  Keep your skin clean and dry. - I have also refilled your inhaler and sent some prednisone to help with the shortness of breath and the swollen uvula.  If you feel that your breathing worsens, you should go to the ER. - Contact below to try to make an appointment to establish for primary care.  It may benefit you to have pulmonary function testing to see if you have any underlying lung disease, potentially relating to smoking and/or COVID.  [facility] at [facility], [address], [address]. Phone number: [phone] Please call this number to establish with primary care

## 2021-11-19 NOTE — ED Provider Notes (Signed)
[facility]    CSN: [id number] Arrival date & time: 11/19/21  1110      History   Chief Complaint Chief Complaint  Patient presents with   Shortness of Breath   Rash    HPI Jeffery Aguilar is a 41 y.o. male presenting with 2 separate complaints.  First patient states that he has had a rash on his right calf for the past month or so.  He reports that he has a history of getting fungal rashes and has been seen here previously and given a cream that works.  He does sweat a lot during the day and thinks it has caused him to have this rash.  He cannot identify any other potential triggers and nothing is changed in his routine.  Patient also reports shortness of breath for the past several days, but honestly since last year after having COVID-19.  Symptoms worse in the past couple of days.  He denies any associated cough or fever.  He says he feels like he is breathing shallow and rapid when he is resting and trying to relax but when he gets up and moves around things seem to get better.  Denies any throat pain.  Patient is a smoker.  Reports smoking half a pack a day for a long period time.  He denies any known history of COPD or asthma but has been prescribed an inhaler in the past and says that does seem to help when he uses it.  He would like a refill.  He denies any associated chest pain, palpitations or dizziness.  Does not currently have a PCP.  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 Authorizing Provider  albuterol (VENTOLIN HFA) 108 (90 Base) MCG/ACT inhaler Inhale 1-2 puffs into the lungs every 6 (six) hours as needed for wheezing or shortness of breath. 11/19/21  Yes Shirlee Latch, PA-C  allopurinol (ZYLOPRIM) 300 MG tablet Take by mouth. 06/19/21  Yes [provider]  amphetamine-dextroamphetamine (ADDERALL) 20 MG tablet Take 20 mg by mouth 3 (three) times daily.   Yes [provider]  clotrimazole-betamethasone (LOTRISONE) cream Apply to affected area 2 times daily prn 11/19/21  Yes Shirlee Latch, PA-C  Colchicine 0.6 MG CAPS colchicine 0.6 mg capsule  Take 1 capsule every day by oral route.   Yes [provider]  colchicine 0.6 MG tablet Take by mouth. 05/22/21  Yes [provider]  ibuprofen (ADVIL) 600 MG tablet Take 1 tablet (600 mg total) by mouth every 6 (six) hours as needed. 08/08/20  Yes Domenick Gong, MD  predniSONE (DELTASONE) 20 MG tablet Take 2 tablets (40 mg total) by mouth daily for 5 days. 11/19/21 11/24/21  Yes Shirlee Latch, PA-C  ALPRAZolam Prudy Feeler) 1 MG tablet Take 1 tablet by mouth daily as needed. 08/14/15   [provider]  buPROPion (WELLBUTRIN XL) 150 MG 24 hr tablet  05/09/20   [provider]  buPROPion (WELLBUTRIN XL) 300 MG 24 hr tablet Take 1 tablet by mouth every morning. 07/01/20   [provider]  levocetirizine (XYZAL) 5 MG tablet Take by mouth.    [provider]  UNABLE TO FIND Adderall    [provider]  UNABLE TO FIND No Medications Reported    [provider]    Family History Family History  Problem Relation Age of Onset   Healthy Mother     Social History Social History   Tobacco Use   Smoking  status: Every Day    Types: Cigarettes   Smokeless tobacco: Never  Vaping Use   Vaping Use: Never used  Substance Use Topics   Alcohol use: Yes    Comment: rare   Drug use: No    Comment: pt denies illegal drug use     Allergies   Patient has no known allergies.   Review of Systems Review of Systems  Constitutional:  Negative for fatigue and fever.  HENT:  Negative for congestion, sore throat and trouble swallowing.   Respiratory:  Positive for shortness of breath. Negative for cough and wheezing.   Cardiovascular:  Negative for chest pain and palpitations.  Skin:  Positive for rash.  Neurological:  Negative for dizziness and weakness.     Physical Exam Triage Vital Signs ED Triage Vitals  Enc Vitals Group     BP 11/19/21 1202 112/87     Pulse Rate 11/19/21 1202 (!) 105     Resp 11/19/21 1202 18     Temp 11/19/21 1202 98.1 F (36.7 C)     Temp Source 11/19/21 1202 Oral     SpO2 11/19/21 1202 98 %     Weight --      Height --      Head Circumference --      Peak Flow --      Pain Score 11/19/21 1158 0     Pain Loc --      Pain Edu? --      Excl. in GC? --    No data found.  Updated Vital Signs BP 112/87 (BP Location: Left Arm)   Pulse (!) 105   Temp 98.1 F (36.7 C) (Oral)   Resp 18   SpO2 98%      Physical Exam Vitals and nursing note reviewed.  Constitutional:      General: He is not in acute distress.    Appearance: Normal appearance. He is well-developed. He is not ill-appearing.  HENT:     Head: Normocephalic and atraumatic.     Mouth/Throat:     Mouth: Mucous membranes are moist.     Pharynx: Oropharynx is clear.     Comments: Mild/moderate swelling of uvula Eyes:     General: No scleral icterus.    Conjunctiva/sclera: Conjunctivae normal.  Cardiovascular:     Rate and Rhythm: Regular rhythm. Tachycardia present.     Heart sounds: Normal heart sounds.  Pulmonary:     Effort: Pulmonary effort is normal. No respiratory distress.      Breath sounds: Normal breath sounds. No wheezing, rhonchi or rales.  Musculoskeletal:     Cervical back: Neck supple.  Skin:    General: Skin is warm and dry.  Capillary Refill: Capillary refill takes less than 2 seconds.     Findings: Rash (erythematous patchy maculopapular rash of right calf) present.  Neurological:     General: No focal deficit present.     Mental Status: He is alert. Mental status is at baseline.     Motor: No weakness.     Gait: Gait normal.  Psychiatric:        Mood and Affect: Mood normal.        Behavior: Behavior normal.      UC Treatments / Results  Labs (all labs ordered are listed, but only abnormal results are displayed) Labs Reviewed - No data to display  EKG   Radiology No results found.  Procedures Procedures (including critical care time)  Medications Ordered in UC Medications - No data to display  Initial Impression / Assessment and Plan / UC Course  I have reviewed the triage vital signs and the nursing notes.  Pertinent labs & imaging results that were available during my care of the patient were reviewed by me and considered in my medical decision making (see chart for details).  41 year old male presenting for rash of right calf and also shortness of breath.  History of fungal skin infections.  Request Lotrisone.  Patient also reports shortness of breath since having COVID-19 last year and symptoms have been a little worse recently.  He also smokes half pack a day for a long period of time.  Vitals are stable and he is overall well-appearing.  No distress. Rash may be consistent with fungal skin infection or contact dermatitis.  We will treat with Lotrisone to cover both possibilities.  His chest is clear auscultation and heart regular rhythm.  He does have mild to moderate uvular swelling.  Concerned about the possibility of COPD or restrictive lung disease.  We will refill the albuterol inhaler and treat with short course of  prednisone which may also help the uvular swelling.  May also have OSA, related to uvular swelling.  In any case I advised him to follow-up with the primary care provider.  He does not have 1 so I directed him to [facility] medical primary care.  Reviewed ER precautions and encouraged him to try to stop smoking.   Final Clinical Impressions(s) / UC Diagnoses   Final diagnoses:  Rash  Shortness of breath  Uvular swelling     Discharge Instructions      The office-I have sent a cream to the pharmacy for the rash.  Hopefully this helps.  Keep your skin clean and dry. - I have also refilled your inhaler and sent some prednisone to help with the shortness of breath and the swollen uvula.  If you feel that your breathing worsens, you should go to the ER. - Contact below to try to make an appointment to establish for primary care.  It may benefit you to have pulmonary function testing to see if you have any underlying lung disease, potentially relating to smoking and/or COVID.  [facility] at [facility], [address], [address]. Phone number: [phone] Please call this number to establish with primary care      ED Prescriptions     Medication Sig Dispense Auth. Provider   clotrimazole-betamethasone (LOTRISONE) cream Apply to affected area 2 times daily prn 30 g Serrena Linderman B, PA-C   predniSONE (DELTASONE) 20 MG tablet Take 2 tablets (40 mg total) by mouth daily for 5 days. 10 tablet Shirlee Latch, PA-C  albuterol (VENTOLIN HFA) 108 (90 Base) MCG/ACT inhaler Inhale 1-2 puffs into the lungs every 6 (six) hours as needed for wheezing or shortness of breath. 1 g Shirlee Latch, PA-C      PDMP not reviewed this encounter.   Shirlee Latch, PA-C 11/19/21 1236

## 2023-01-29 DIAGNOSIS — F902 Attention-deficit hyperactivity disorder, combined type: Secondary | ICD-10-CM | POA: Diagnosis not present

## 2023-01-29 DIAGNOSIS — F320 Major depressive disorder, single episode, mild: Secondary | ICD-10-CM | POA: Diagnosis not present

## 2023-04-25 DIAGNOSIS — F320 Major depressive disorder, single episode, mild: Secondary | ICD-10-CM | POA: Diagnosis not present

## 2023-04-25 DIAGNOSIS — F902 Attention-deficit hyperactivity disorder, combined type: Secondary | ICD-10-CM | POA: Diagnosis not present

## 2023-05-24 DIAGNOSIS — Z419 Encounter for procedure for purposes other than remedying health state, unspecified: Secondary | ICD-10-CM | POA: Diagnosis not present

## 2023-06-21 DIAGNOSIS — Z419 Encounter for procedure for purposes other than remedying health state, unspecified: Secondary | ICD-10-CM | POA: Diagnosis not present

## 2023-07-02 DIAGNOSIS — F1990 Other psychoactive substance use, unspecified, uncomplicated: Secondary | ICD-10-CM | POA: Diagnosis not present

## 2023-07-02 DIAGNOSIS — I519 Heart disease, unspecified: Secondary | ICD-10-CM | POA: Diagnosis not present

## 2023-07-02 DIAGNOSIS — I5020 Unspecified systolic (congestive) heart failure: Secondary | ICD-10-CM | POA: Diagnosis not present

## 2023-07-02 DIAGNOSIS — I5021 Acute systolic (congestive) heart failure: Secondary | ICD-10-CM | POA: Diagnosis not present

## 2023-07-02 DIAGNOSIS — F17200 Nicotine dependence, unspecified, uncomplicated: Secondary | ICD-10-CM | POA: Diagnosis not present

## 2023-07-02 DIAGNOSIS — F1490 Cocaine use, unspecified, uncomplicated: Secondary | ICD-10-CM | POA: Diagnosis not present

## 2023-07-02 DIAGNOSIS — I428 Other cardiomyopathies: Secondary | ICD-10-CM | POA: Diagnosis not present

## 2023-07-25 DIAGNOSIS — F902 Attention-deficit hyperactivity disorder, combined type: Secondary | ICD-10-CM | POA: Diagnosis not present

## 2023-07-25 DIAGNOSIS — F320 Major depressive disorder, single episode, mild: Secondary | ICD-10-CM | POA: Diagnosis not present

## 2023-08-27 DIAGNOSIS — I519 Heart disease, unspecified: Secondary | ICD-10-CM | POA: Diagnosis not present

## 2023-08-27 DIAGNOSIS — I428 Other cardiomyopathies: Secondary | ICD-10-CM | POA: Diagnosis not present

## 2023-08-27 DIAGNOSIS — I5021 Acute systolic (congestive) heart failure: Secondary | ICD-10-CM | POA: Diagnosis not present

## 2023-09-01 DIAGNOSIS — Z419 Encounter for procedure for purposes other than remedying health state, unspecified: Secondary | ICD-10-CM | POA: Diagnosis not present

## 2023-10-02 DIAGNOSIS — Z419 Encounter for procedure for purposes other than remedying health state, unspecified: Secondary | ICD-10-CM | POA: Diagnosis not present

## 2023-10-22 DIAGNOSIS — F902 Attention-deficit hyperactivity disorder, combined type: Secondary | ICD-10-CM | POA: Diagnosis not present

## 2023-10-22 DIAGNOSIS — F320 Major depressive disorder, single episode, mild: Secondary | ICD-10-CM | POA: Diagnosis not present

## 2023-11-01 DIAGNOSIS — Z419 Encounter for procedure for purposes other than remedying health state, unspecified: Secondary | ICD-10-CM | POA: Diagnosis not present

## 2023-12-02 DIAGNOSIS — Z419 Encounter for procedure for purposes other than remedying health state, unspecified: Secondary | ICD-10-CM | POA: Diagnosis not present

## 2024-01-02 DIAGNOSIS — Z419 Encounter for procedure for purposes other than remedying health state, unspecified: Secondary | ICD-10-CM | POA: Diagnosis not present

## 2024-01-21 DIAGNOSIS — F902 Attention-deficit hyperactivity disorder, combined type: Secondary | ICD-10-CM | POA: Diagnosis not present

## 2024-01-21 DIAGNOSIS — F320 Major depressive disorder, single episode, mild: Secondary | ICD-10-CM | POA: Diagnosis not present
